# Patient Record
Sex: Female | Born: 1995 | Hispanic: Yes | Marital: Single | Smoking: Never smoker
Health system: Southern US, Community
[De-identification: ages and names within clinical notes are randomized; demographics above are authoritative.]

## PROBLEM LIST (undated history)

## (undated) DIAGNOSIS — K219 Gastro-esophageal reflux disease without esophagitis: Secondary | ICD-10-CM

## (undated) DIAGNOSIS — F32A Depression, unspecified: Secondary | ICD-10-CM

## (undated) DIAGNOSIS — F419 Anxiety disorder, unspecified: Secondary | ICD-10-CM

## (undated) HISTORY — DX: Gastro-esophageal reflux disease without esophagitis: K21.9

## (undated) HISTORY — PX: TONSILLECTOMY: SUR1361

## (undated) HISTORY — DX: Anxiety disorder, unspecified: F41.9

## (undated) HISTORY — DX: Depression, unspecified: F32.A

---

## 2019-06-10 ENCOUNTER — Ambulatory Visit
Admission: EM | Admit: 2019-06-10 | Discharge: 2019-06-10 | Disposition: A | Discharge status: Home / Self Care | Payer: No Typology Code available for payment source | Attending: Family Medicine | Admitting: Family Medicine

## 2019-06-10 ENCOUNTER — Other Ambulatory Visit: Payer: Self-pay

## 2019-06-10 ENCOUNTER — Encounter: Payer: Self-pay | Admitting: Emergency Medicine

## 2019-06-10 DIAGNOSIS — R300 Dysuria: Secondary | ICD-10-CM

## 2019-06-10 LAB — URINALYSIS, COMPLETE (UACMP) WITH MICROSCOPIC
Bilirubin Urine: NEGATIVE
Glucose, UA: NEGATIVE mg/dL
Ketones, ur: NEGATIVE mg/dL
Nitrite: NEGATIVE
Protein, ur: NEGATIVE mg/dL
Specific Gravity, Urine: 1.02 (ref 1.005–1.030)
pH: 5.5 (ref 5.0–8.0)

## 2019-06-10 MED ORDER — FLUCONAZOLE 150 MG PO TABS: 150.0000 mg | ORAL_TABLET | Freq: Every day | ORAL | 0 refills | Status: DC

## 2019-06-10 MED ORDER — CEPHALEXIN 500 MG PO CAPS: 500.0000 mg | ORAL_CAPSULE | Freq: Two times a day (BID) | ORAL | 0 refills | Status: AC

## 2019-06-10 NOTE — ED Triage Notes (Signed)
Patient c/o urinary frequency and dysuria that started on Monday.

## 2019-06-10 NOTE — Discharge Instructions (Addendum)
Take medication as prescribed. Rest. Drink plenty of fluids. Monitor.  ° °Follow up with your primary care physician this week as needed. Return to Urgent care for new or worsening concerns.  ° °

## 2019-06-10 NOTE — ED Provider Notes (Signed)
[facility] ____________________________________________  Time seen: Approximately 7:04 PM  I have reviewed the triage vital signs and the nursing notes.   HISTORY  Chief Complaint Dysuria and Urinary Frequency   HPI Tracey Hebert is a 23 y.o. female presenting for evaluation of 2 days of urinary frequency, urinary urgency, burning with urination and suprapubic pressure.  States this feels like her previous urinary infections.  Has had occasional vaginal discharge, nonodorous, denies concerns of STDs and states no persistent vaginal discharge.  Denies abdominal pain, flank pain, fever or vomiting.  Occasional nausea.  Denies bowel changes.  Denies recent cough, sore throat or congestion or sickness.  Denies aggravating or alleviating factors.  Reports otherwise doing well.  Patient's last menstrual period was 06/04/2019.   History reviewed. No pertinent past medical history.  There are no active problems to display for this patient.   Past Surgical History:  Procedure Laterality Date  . TONSILLECTOMY       No current facility-administered medications for this encounter.   Current Outpatient Medications:  .  cephALEXin (KEFLEX) 500 MG capsule, Take 1 capsule (500 mg total) by mouth 2 (two) times daily for 7 days., Disp: 14 capsule, Rfl: 0 .  fluconazole (DIFLUCAN) 150 MG tablet, Take 1 tablet (150 mg total) by mouth daily. Take one pill orally,as needed, Disp: 1 tablet, Rfl: 0  Allergies Patient has no known allergies.  History reviewed. No pertinent family history.  Social History Social History   Tobacco Use  . Smoking status: Never Smoker  . Smokeless tobacco: Never Used  Substance Use Topics  . Alcohol use: Not Currently  . Drug use: Never    Review of Systems Constitutional: No fever ENT: No sore throat. Cardiovascular: Denies chest pain. Respiratory: Denies shortness of breath. Gastrointestinal: No abdominal pain.  No nausea, no vomiting.   No diarrhea.   Genitourinary: Positive for dysuria. Musculoskeletal: Negative for back pain. Skin: Negative for rash.   ____________________________________________   PHYSICAL EXAM:  VITAL SIGNS: ED Triage Vitals  Enc Vitals Group     BP 06/10/19 1759 121/61     Pulse Rate 06/10/19 1759 61     Resp 06/10/19 1759 18     Temp 06/10/19 1759 99.2 F (37.3 C)     Temp Source 06/10/19 1759 Oral     SpO2 06/10/19 1759 100 %     Weight 06/10/19 1754 155 lb (70.3 kg)     Height 06/10/19 1754 5\' 3"  (1.6 m)     Head Circumference --      Peak Flow --      Pain Score 06/10/19 1753 5     Pain Loc --      Pain Edu? --      Excl. in GC? --     Constitutional: Alert and oriented. Well appearing and in no acute distress. Eyes: Conjunctivae are normal.  ENT      Head: Normocephalic and atraumatic. Cardiovascular: Normal rate, regular rhythm. Grossly normal heart sounds.  Good peripheral circulation. Respiratory: Normal respiratory effort without tachypnea nor retractions. Breath sounds are clear and equal bilaterally. No wheezes, rales, rhonchi. Gastrointestinal: Soft and nontender.No CVA tenderness. Musculoskeletal: Steady gait.   Neurologic:  Normal speech and language. Speech is normal. No gait instability.  Skin:  Skin is warm, dry and intact. No rash noted. Psychiatric: Mood and affect are normal. Speech and behavior are normal. Patient exhibits appropriate insight and judgment   ___________________________________________   LABS (all labs ordered are listed,  but only abnormal results are displayed)  Labs Reviewed  URINALYSIS, COMPLETE (UACMP) WITH MICROSCOPIC - Abnormal; Notable for the following components:      Result Value   Hgb urine dipstick MODERATE (*)    Leukocytes,Ua TRACE (*)    Bacteria, UA RARE (*)    All other components within normal limits  URINE CULTURE    PROCEDURES Procedures    INITIAL IMPRESSION / ASSESSMENT AND PLAN / ED COURSE  Pertinent labs  & imaging results that were available during my care of the patient were reviewed by me and considered in my medical decision making (see chart for details).  Well-appearing patient.  No acute distress.  Presenting for evaluation of dysuria.  Urinalysis reviewed.  Discussed multiple differentials including UTI, vaginal infection as well as nephrolithiasis.  Patient denies any flank or abdominal pain and only reports minimal suprapubic pressure, felt nephrolithiasis less likely and patient declined imaging.  Patient declined wet prep at this time and states that she will monitor.  Will culture urine empirically treat with oral Keflex.  Patient requests empiric post antibiotic Diflucan, Rx given.  Encourage rest, fluids and supportive care.Discussed indication, risks and benefits of medications with patient.   Discussed follow up with Primary care physician this week. Discussed follow up and return parameters including no resolution or any worsening concerns. Patient verbalized understanding and agreed to plan.   ____________________________________________   FINAL CLINICAL IMPRESSION(S) / ED DIAGNOSES  Final diagnoses:  Dysuria     ED Discharge Orders         Ordered    cephALEXin (KEFLEX) 500 MG capsule  2 times daily     06/10/19 1914    fluconazole (DIFLUCAN) 150 MG tablet  Daily     06/10/19 1914           Note: This dictation was prepared with Dragon dictation along with smaller phrase technology. Any transcriptional errors that result from this process are unintentional.         Marylene Land, NP 06/10/19 2019

## 2019-06-11 LAB — URINE CULTURE: Culture: <10000 — AB

## 2019-12-03 ENCOUNTER — Ambulatory Visit: Payer: No Typology Code available for payment source | Admitting: Family Medicine

## 2020-01-11 ENCOUNTER — Other Ambulatory Visit: Payer: Self-pay | Admitting: Family Medicine

## 2020-01-11 DIAGNOSIS — Z3401 Encounter for supervision of normal first pregnancy, first trimester: Secondary | ICD-10-CM

## 2020-01-12 ENCOUNTER — Ambulatory Visit
Admission: RE | Admit: 2020-01-12 | Discharge: 2020-01-12 | Disposition: A | Discharge status: Home / Self Care | Payer: No Typology Code available for payment source | Source: Ambulatory Visit | Attending: Family Medicine | Admitting: Family Medicine

## 2020-01-12 ENCOUNTER — Other Ambulatory Visit: Payer: Self-pay | Admitting: Family Medicine

## 2020-01-12 ENCOUNTER — Other Ambulatory Visit: Payer: Self-pay

## 2020-01-12 DIAGNOSIS — Z3491 Encounter for supervision of normal pregnancy, unspecified, first trimester: Secondary | ICD-10-CM

## 2020-01-12 DIAGNOSIS — O3680X1 Pregnancy with inconclusive fetal viability, fetus 1: Secondary | ICD-10-CM | POA: Insufficient documentation

## 2020-01-12 DIAGNOSIS — Z3401 Encounter for supervision of normal first pregnancy, first trimester: Secondary | ICD-10-CM

## 2020-01-12 DIAGNOSIS — O209 Hemorrhage in early pregnancy, unspecified: Secondary | ICD-10-CM

## 2020-01-12 DIAGNOSIS — O039 Complete or unspecified spontaneous abortion without complication: Secondary | ICD-10-CM | POA: Diagnosis not present

## 2020-01-12 LAB — CBC
HCT: 38.5 % (ref 36.0–46.0)
Hemoglobin: 13 g/dL (ref 12.0–15.0)
MCH: 28.8 pg (ref 26.0–34.0)
MCHC: 33.8 g/dL (ref 30.0–36.0)
MCV: 85.4 fL (ref 80.0–100.0)
Platelets: 315 10*3/uL (ref 150–400)
RBC: 4.51 MIL/uL (ref 3.87–5.11)
RDW: 13.4 % (ref 11.5–15.5)
WBC: 11 10*3/uL — ABNORMAL HIGH (ref 4.0–10.5)
nRBC: 0 % (ref 0.0–0.2)

## 2020-01-12 LAB — POCT PREGNANCY, URINE: Preg Test, Ur: POSITIVE — AB

## 2020-01-12 NOTE — ED Triage Notes (Signed)
Pt arrives to ED via POV from home with c/o possible miscarriage. Pt reports having an US done today with abnormal results ("no fetal pole"). Pt reports heavy vaginal bleeding and passing of POC. Pt reports using "over 6" feminine pads over the last several hours. Pt is A&O, in NAD; RR even, regular, and unlabored.

## 2020-01-13 ENCOUNTER — Emergency Department
Admission: EM | Admit: 2020-01-13 | Discharge: 2020-01-13 | Disposition: A | Discharge status: Home / Self Care | Payer: No Typology Code available for payment source | Attending: Emergency Medicine | Admitting: Emergency Medicine

## 2020-01-13 DIAGNOSIS — O418X10 Other specified disorders of amniotic fluid and membranes, first trimester, not applicable or unspecified: Secondary | ICD-10-CM

## 2020-01-13 DIAGNOSIS — O039 Complete or unspecified spontaneous abortion without complication: Secondary | ICD-10-CM

## 2020-01-13 DIAGNOSIS — O468X1 Other antepartum hemorrhage, first trimester: Secondary | ICD-10-CM

## 2020-01-13 LAB — COMPREHENSIVE METABOLIC PANEL
ALT: 17 U/L (ref 0–44)
AST: 15 U/L (ref 15–41)
Albumin: 4.7 g/dL (ref 3.5–5.0)
Alkaline Phosphatase: 49 U/L (ref 38–126)
Anion gap: 7 (ref 5–15)
BUN: 17 mg/dL (ref 6–20)
CO2: 25 mmol/L (ref 22–32)
Calcium: 8.9 mg/dL (ref 8.9–10.3)
Chloride: 106 mmol/L (ref 98–111)
Creatinine, Ser: 0.8 mg/dL (ref 0.44–1.00)
GFR calc Af Amer: >60 mL/min (ref >60)
GFR calc non Af Amer: >60 mL/min (ref >60)
Glucose, Bld: 113 mg/dL — ABNORMAL HIGH (ref 70–99)
Potassium: 3.8 mmol/L (ref 3.5–5.1)
Sodium: 138 mmol/L (ref 135–145)
Total Bilirubin: 0.6 mg/dL (ref 0.3–1.2)
Total Protein: 7.4 g/dL (ref 6.5–8.1)

## 2020-01-13 LAB — HCG, QUANTITATIVE, PREGNANCY: hCG, Beta Chain, Quant, S: 2655 m[IU]/mL — ABNORMAL HIGH (ref <5)

## 2020-01-13 LAB — ABO/RH: ABO/RH(D): AB POS

## 2020-01-13 MED ORDER — HYDROCODONE-ACETAMINOPHEN 5-325 MG PO TABS: 1.0000 | ORAL_TABLET | Freq: Four times a day (QID) | ORAL | 0 refills | Status: DC | PRN

## 2020-01-13 MED ORDER — HYDROCODONE-ACETAMINOPHEN 5-325 MG PO TABS
1.0000 | ORAL_TABLET | Freq: Once | ORAL | Status: AC
  Administered 2020-01-13: 1 via ORAL
  Filled 2020-01-13: qty 1

## 2020-01-13 NOTE — Discharge Instructions (Signed)
You may take Norco as needed for severe pain.  Drink plenty of fluids and restrict your activity.  Do not use tampons, douche or have sexual intercourse until seen by your doctor.  Return to the ER for worsening symptoms, soaking more than 1 maxi pad per hour, fainting or other concerns.

## 2020-01-13 NOTE — ED Provider Notes (Signed)
[facility] Emergency Department Provider Note   ____________________________________________   First MD Initiated Contact with Patient 01/13/20 [phone]     (approximate)  I have reviewed the triage vital signs and the nursing notes.   HISTORY  Chief Complaint Miscarriage    HPI Tracey Hebert is a 24 y.o. female G1, P0 who presents to the ED from home with a chief complaint of vaginal bleeding.  LMP in April, unsure of dates.  Patient had an ultrasound done which was ordered from [facility] yesterday which demonstrated: "Single intrauterine gestational sac measuring 6 weeks 3 days by mean sac diameter. No yolk sac or embryo visualized.  Small subchorionic hemorrhage.  Findings are suspicious but not yet definitive for failed pregnancy. Recommendfollow-up [place] in 14 days for definitive diagnosis."  Patient has had vaginal bleeding x3 to 4 days with pelvic cramping.  States she passed clots and what looked like a sac this evening.  Reports beta hCG done yesterday around 2600.  Last sexual intercourse 5 days ago.  Denies fever, cough, chest pain, shortness of breath, nausea, vomiting or dysuria.     Past medical history None  There are no problems to display for this patient.   Past Surgical History:  Procedure Laterality Date  . TONSILLECTOMY      Prior to Admission medications   Medication Sig Start Date End Date Taking? Authorizing Provider  fluconazole (DIFLUCAN) 150 MG tablet Take 1 tablet (150 mg total) by mouth daily. Take one pill orally,as needed 06/10/19   Renford Dills, NP    Allergies Patient has no known allergies.  No family history on file.  Social History Social History   Tobacco Use  . Smoking status: Never Smoker  . Smokeless tobacco: Never Used  Substance Use Topics  . Alcohol use: Not Currently  . Drug use: Never    Review of Systems  Constitutional: No fever/chills Eyes: No visual changes. ENT:  No sore throat. Cardiovascular: Denies chest pain. Respiratory: Denies shortness of breath. Gastrointestinal: No abdominal pain.  No nausea, no vomiting.  No diarrhea.  No constipation. Genitourinary: Negative for dysuria. Musculoskeletal: Negative for back pain. Skin: Negative for rash. Neurological: Negative for headaches, focal weakness or numbness.   ____________________________________________   PHYSICAL EXAM:  VITAL SIGNS: ED Triage Vitals  Enc Vitals Group     BP 01/12/20 2317 123/73     Pulse Rate 01/12/20 2317 62     Resp 01/12/20 2317 18     Temp 01/12/20 2317 98.3 F (36.8 C)     Temp Source 01/12/20 2317 Oral     SpO2 01/12/20 2317 100 %     Weight 01/12/20 2322 175 lb (79.4 kg)     Height 01/12/20 2322 5\' 4"  (1.626 m)     Head Circumference --      Peak Flow --      Pain Score 01/12/20 2322 9     Pain Loc --      Pain Edu? --      Excl. in GC? --     Constitutional: Alert and oriented. Well appearing and in no acute distress. Eyes: Conjunctivae are normal. PERRL. EOMI. Head: Atraumatic. Nose: No congestion/rhinnorhea. Mouth/Throat: Mucous membranes are moist.  Oropharynx non-erythematous. Neck: No stridor.   Cardiovascular: Normal rate, regular rhythm. Grossly normal heart sounds.  Good peripheral circulation. Respiratory: Normal respiratory effort.  No retractions. Lungs CTAB. Gastrointestinal: Soft and nontender to light or deep palpation. No distention. No abdominal  bruits. No CVA tenderness. Pelvic: External exam within normal limits without rashes, lesions or vesicles.  Speculum exam reveals mild vaginal bleeding.  Cervical os is closed.  Bimanual exam unremarkable. Musculoskeletal: No lower extremity tenderness nor edema.  No joint effusions. Neurologic:  Normal speech and language. No gross focal neurologic deficits are appreciated. No gait instability. Skin:  Skin is warm, dry and intact. No rash noted. Psychiatric: Mood and affect are normal.  Speech and behavior are normal.  ____________________________________________   LABS (all labs ordered are listed, but only abnormal results are displayed)  Labs Reviewed  CBC - Abnormal; Notable for the following components:      Result Value   WBC 11.0 (*)    All other components within normal limits  COMPREHENSIVE METABOLIC PANEL - Abnormal; Notable for the following components:   Glucose, Bld 113 (*)    All other components within normal limits  HCG, QUANTITATIVE, PREGNANCY - Abnormal; Notable for the following components:   hCG, Beta Chain, Quant, S 2,655 (*)    All other components within normal limits  POCT PREGNANCY, URINE - Abnormal; Notable for the following components:   Preg Test, Ur POSITIVE (*)    All other components within normal limits  POC URINE PREG, ED  ABO/RH   ____________________________________________  EKG  None ____________________________________________  RADIOLOGY  ED MD interpretation: Small subchorionic hemorrhage, single IUP 6-week 3-day gestational sac without yolk sac or embryo  Official radiology report(s): US OB LESS THAN 14 WEEKS WITH OB TRANSVAGINAL  Result Date: 01/12/2020 CLINICAL DATA:  First trimester vaginal bleeding.  Unsure of LMP. EXAM: OBSTETRIC <14 WK [place] AND TRANSVAGINAL OB US TECHNIQUE: Both transabdominal and transvaginal ultrasound examinations were performed for complete evaluation of the gestation as well as the maternal uterus, adnexal regions, and pelvic cul-de-sac. Transvaginal technique was performed to assess early pregnancy. COMPARISON:  None. FINDINGS: Intrauterine gestational sac: Single; thin choriodecidual reaction noted Yolk sac:  Not Visualized. Embryo:  Not Visualized. MSD: 16 mm   6 w   3 d Subchorionic hemorrhage:  Small subchorionic hemorrhage noted. Maternal uterus/adnexae: Both ovaries are normal appearance. No mass or abnormal free fluid identified. IMPRESSION: Single intrauterine gestational sac measuring 6  weeks 3 days by mean sac diameter. No yolk sac or embryo visualized. Findings are suspicious but not yet definitive for failed pregnancy. Recommend follow-up US in 14 days for definitive diagnosis. This recommendation follows SRU consensus guidelines: Diagnostic Criteria for Nonviable Pregnancy Early in the First Trimester. Malva Limes Med 2013; 427:0623-76. Electronically Signed   By: Danae Orleans M.D.   On: 01/12/2020 16:10    ____________________________________________   PROCEDURES  Procedure(s) performed (including Critical Care):  Procedures   ____________________________________________   INITIAL IMPRESSION / ASSESSMENT AND PLAN / ED COURSE  As part of my medical decision making, I reviewed the following data within the electronic MEDICAL RECORD NUMBER History obtained from family, Nursing notes reviewed and incorporated, Labs reviewed, Old chart reviewed, Radiograph reviewed, Notes from prior ED visits and [place] Controlled Substance Database     Zaida Reiland was evaluated in Emergency Department on 01/13/2020 for the symptoms described in the history of present illness. She was evaluated in the context of the global COVID-19 pandemic, which necessitated consideration that the patient might be at risk for infection with the SARS-CoV-2 virus that causes COVID-19. Institutional protocols and algorithms that pertain to the evaluation of patients at risk for COVID-19 are in a state of rapid change based on information  released by regulatory bodies including the CDC and federal and state organizations. These policies and algorithms were followed during the patient's care in the ED.    24 year old G1, P0 presenting with vaginal bleeding. Differential diagnosis includes, but is not limited to, ovarian cyst, ovarian torsion, acute appendicitis, diverticulitis, urinary tract infection/pyelonephritis, endometriosis, bowel obstruction, colitis, renal colic, gastroenteritis, hernia, fibroids,  endometriosis, pregnancy related pain including ectopic pregnancy, etc.   Patient is AB+ with ultrasound concerning for miscarriage.  Will administer analgesia.  Encouraged follow-up with OB/GYN for repeat ultrasound and beta hCG in 1 week.  Strict return precautions given.  Patient and family member verbalized understanding agree with plan of care.      ____________________________________________   FINAL CLINICAL IMPRESSION(S) / ED DIAGNOSES  Final diagnoses:  Miscarriage  Subchorionic hematoma in first trimester, single or unspecified fetus     ED Discharge Orders    None       Note:  This document was prepared using Dragon voice recognition software and may include unintentional dictation errors.   Irean Hong, MD 01/13/20 [phone]

## 2020-01-27 ENCOUNTER — Ambulatory Visit: Payer: Self-pay

## 2020-01-27 ENCOUNTER — Ambulatory Visit
Admission: RE | Admit: 2020-01-27 | Discharge: 2020-01-27 | Disposition: A | Discharge status: Home / Self Care | Payer: No Typology Code available for payment source | Source: Ambulatory Visit | Attending: Emergency Medicine | Admitting: Emergency Medicine

## 2020-01-27 ENCOUNTER — Other Ambulatory Visit: Payer: Self-pay

## 2020-01-27 VITALS — BP 120/89 | HR 76 | Temp 98.5°F | Resp 18 | Ht 64.0 in | Wt 175.0 lb

## 2020-01-27 DIAGNOSIS — J0140 Acute pansinusitis, unspecified: Secondary | ICD-10-CM | POA: Diagnosis not present

## 2020-01-27 MED ORDER — IBUPROFEN 600 MG PO TABS
600.0000 mg | ORAL_TABLET | Freq: Four times a day (QID) | ORAL | 0 refills | Status: DC | PRN
Start: 2020-01-27 — End: 2020-05-27

## 2020-01-27 MED ORDER — IPRATROPIUM BROMIDE 0.06 % NA SOLN
2.0000 | Freq: Four times a day (QID) | NASAL | 0 refills | Status: DC
Start: 2020-01-27 — End: 2020-05-27

## 2020-01-27 MED ORDER — DOXYCYCLINE HYCLATE 100 MG PO CAPS
100.0000 mg | ORAL_CAPSULE | Freq: Two times a day (BID) | ORAL | 0 refills | Status: AC
Start: 2020-01-27 — End: 2020-02-03

## 2020-01-27 MED ORDER — FLUTICASONE PROPIONATE 50 MCG/ACT NA SUSP
2.0000 | Freq: Every day | NASAL | 0 refills | Status: DC
Start: 2020-01-27 — End: 2020-12-22

## 2020-01-27 NOTE — ED Triage Notes (Addendum)
Pt c/o sinus congestion, post nasal drainage, sinus pain and pressure and right ear pain. Started about 2 days ago. Denies fever. Pt declines covid testing.

## 2020-01-27 NOTE — ED Provider Notes (Signed)
HPI  SUBJECTIVE:  Tracey Hebert is a 24 y.o. female who presents with 2 days of sinus pain/pressure, nasal congestion, clear rhinorrhea, postnasal drip, right ear pain.  She reports sinus headache, cough secondary to the postnasal drip.  She denies fevers, body aches, sore throat, loss of sense of smell or taste, shortness of breath, nausea, vomiting, abdominal pain, Covid exposure.  She had Covid in January 2021 and had both doses of the vaccine in March.  She reports diarrhea, states that this is resolving from a recent trip to [place].  No facial swelling, upper dental pain.  No allergy symptoms.  No change in hearing, otorrhea.  She tried Mucinex, hot teas, Tylenol sinus.  TM Tylenol Sinus seems to help.  No aggravating factors.  No no biotics recently.  She took Tylenol within 6 hours of evaluation.  She has a past medical history frequent sinusitis and Covid.  No history of diabetes, hypertension, allergies, asthma.  LMP: 3 months ago.  States that she had a miscarriage 2 weeks ago.  She denies the possibility being pregnant.  PMD: Emogene Morgan, MD  History reviewed. No pertinent past medical history.  Past Surgical History:  Procedure Laterality Date  . TONSILLECTOMY      History reviewed. No pertinent family history.  Social History   Tobacco Use  . Smoking status: Never Smoker  . Smokeless tobacco: Never Used  Vaping Use  . Vaping Use: Never used  Substance Use Topics  . Alcohol use: Not Currently  . Drug use: Never    No current facility-administered medications for this encounter.  Current Outpatient Medications:  .  doxycycline (VIBRAMYCIN) 100 MG capsule, Take 1 capsule (100 mg total) by mouth 2 (two) times daily for 7 days., Disp: 14 capsule, Rfl: 0 .  fluticasone (FLONASE) 50 MCG/ACT nasal spray, Place 2 sprays into both nostrils daily., Disp: 16 g, Rfl: 0 .  ibuprofen (ADVIL) 600 MG tablet, Take 1 tablet (600 mg total) by mouth every 6 (six) hours as  needed., Disp: 30 tablet, Rfl: 0 .  ipratropium (ATROVENT) 0.06 % nasal spray, Place 2 sprays into both nostrils 4 (four) times daily., Disp: 15 mL, Rfl: 0  No Known Allergies   ROS  As noted in HPI.   Physical Exam  BP (!) 120/89 (BP Location: Right Arm)   Pulse 76   Temp 98.5 F (36.9 C) (Oral)   Resp 18   Ht 5\' 4"  (1.626 m)   Wt 79.4 kg   LMP 10/28/2019 Comment: pt had a recent miscarriage about 3 weeks ago  SpO2 99%   BMI 30.05 kg/m    Constitutional: Well developed, well nourished, no acute distress Eyes:  EOMI, conjunctiva normal bilaterally HENT: Normocephalic, atraumatic,mucus membranes moist.  TMs normal bilateral.  Erythematous, swollen turbinates.  Clear nasal congestion.  Positive maxillary, frontal sinus tenderness.  Tonsils surgically absent.  Normal oropharynx.  Extensive postnasal drip.  Positive cobblestoning. Neck: No cervical lymphadenopathy Respiratory: Normal inspiratory effort, lungs clear bilaterally Cardiovascular: Normal rate regular rhythm no murmurs rubs or gallops GI: nondistended skin: No rash, skin intact Musculoskeletal: no deformities Neurologic: Alert & oriented x 3, no focal neuro deficits Psychiatric: Speech and behavior appropriate   ED Course   Medications - No data to display  No orders of the defined types were placed in this encounter.   No results found for this or any previous visit (from the past 24 hour(s)). No results found.  ED Clinical Impression  1.  Acute non-recurrent pansinusitis      ED Assessment/Plan  Patient with sinusitis, most likely viral.  Discussed this with patient.  Home with Mucinex D, saline nasal irrigation, Flonase, Atrovent nasal spray given the extensive postnasal drip, ibuprofen combined with Tylenol 3-4 times a day as needed for pain.  We will give her a wait-and-see prescription of doxycycline, but discussed the indications for filling this.  Advised her to not fill this for 8 days, unless  she starts running a fever or for double sickening.  There is no evidence of otitis today.  Follow-up with PMD as needed.  Discussed MDM, treatment plan, and plan for follow-up with patient.patient agrees with plan.   Meds ordered this encounter  Medications  . doxycycline (VIBRAMYCIN) 100 MG capsule    Sig: Take 1 capsule (100 mg total) by mouth 2 (two) times daily for 7 days.    Dispense:  14 capsule    Refill:  0  . fluticasone (FLONASE) 50 MCG/ACT nasal spray    Sig: Place 2 sprays into both nostrils daily.    Dispense:  16 g    Refill:  0  . ipratropium (ATROVENT) 0.06 % nasal spray    Sig: Place 2 sprays into both nostrils 4 (four) times daily.    Dispense:  15 mL    Refill:  0  . ibuprofen (ADVIL) 600 MG tablet    Sig: Take 1 tablet (600 mg total) by mouth every 6 (six) hours as needed.    Dispense:  30 tablet    Refill:  0    *This clinic note was created using Scientist, clinical (histocompatibility and immunogenetics). Therefore, there may be occasional mistakes despite careful proofreading.   ?    Domenick Gong, MD 01/27/20 2052

## 2020-01-27 NOTE — Discharge Instructions (Addendum)
Mucinex D, saline nasal irrigation Lloyd Huger med rinse and distilled water as often as you want, Flonase for the inflammation, Atrovent nasal spray for the drainage, 600 mg of ibuprofen combined with 1000 mg of Tylenol 3-4 times a day as needed for pain. wait-and-see prescription of doxycycline-do not fill this for 8 days, unless she starts running a fever or for double sickening.

## 2020-05-27 ENCOUNTER — Other Ambulatory Visit: Payer: Self-pay

## 2020-05-27 ENCOUNTER — Ambulatory Visit
Admission: RE | Admit: 2020-05-27 | Discharge: 2020-05-27 | Disposition: A | Discharge status: Home / Self Care | Payer: No Typology Code available for payment source | Source: Ambulatory Visit | Attending: Emergency Medicine | Admitting: Emergency Medicine

## 2020-05-27 VITALS — BP 129/73 | HR 68 | Temp 97.8°F | Resp 18

## 2020-05-27 DIAGNOSIS — J069 Acute upper respiratory infection, unspecified: Secondary | ICD-10-CM | POA: Diagnosis not present

## 2020-05-27 DIAGNOSIS — J0140 Acute pansinusitis, unspecified: Secondary | ICD-10-CM

## 2020-05-27 MED ORDER — BENZONATATE 200 MG PO CAPS
200.0000 mg | ORAL_CAPSULE | Freq: Three times a day (TID) | ORAL | 0 refills | Status: DC | PRN
Start: 2020-05-27 — End: 2020-12-22

## 2020-05-27 MED ORDER — AMOXICILLIN-POT CLAVULANATE 875-125 MG PO TABS
1.0000 | ORAL_TABLET | Freq: Two times a day (BID) | ORAL | 0 refills | Status: DC
Start: 2020-05-27 — End: 2020-12-22

## 2020-05-27 MED ORDER — HYDROCOD POLST-CPM POLST ER 10-8 MG/5ML PO SUER: 5.0000 mL | Freq: Two times a day (BID) | ORAL | 0 refills | Status: DC | PRN

## 2020-05-27 MED ORDER — AEROCHAMBER PLUS MISC
2 refills | Status: DC
Start: 2020-05-27 — End: 2020-12-22

## 2020-05-27 MED ORDER — ALBUTEROL SULFATE HFA 108 (90 BASE) MCG/ACT IN AERS: 1.0000 | INHALATION_SPRAY | RESPIRATORY_TRACT | 0 refills | Status: DC | PRN

## 2020-05-27 MED ORDER — IBUPROFEN 600 MG PO TABS: 600.0000 mg | ORAL_TABLET | Freq: Four times a day (QID) | ORAL | 0 refills | Status: DC | PRN

## 2020-05-27 NOTE — ED Triage Notes (Signed)
Pt c/o productive cough with mucus and nasal congestion onset Monday. Pt states she was covid tested on Tuesday which was negative. Pt states she has not been able to sleep due to cough. Pt states she has been taking mucinex.

## 2020-05-27 NOTE — Discharge Instructions (Addendum)
Take the medication as written. Start Mucinex-D to keep the mucous thin and to decongest you.  You may take 600 mg of motrin with 1 gram of tylenol up to 3-4 times a day as needed for pain. This is an effective combination for pain.  Most sinus infections are viral and do not need antibiotics unless you have a high fever, have had this for 10 days, or you get better and then get sick again. Use a NeilMed sinus rinse as often as you want to to reduce nasal congestion. Follow the directions on the box.  Continue Flonase.  2 puffs from your albuterol inhaler using your spacer every 4-6 hours as needed for cough.  Tessalon for the cough during the day, Tussionex for the cough at night.  Do not fill the Augmentin.  Wait another 3 to 5 days before filling it.  Go to www.goodrx.com to look up your medications. This will give you a list of where you can find your prescriptions at the most affordable prices. Or you can ask the pharmacist what the cash price is. This is frequently cheaper than going through insurance.

## 2020-05-27 NOTE — ED Provider Notes (Signed)
HPI  SUBJECTIVE:  Tracey Hebert is a 24 y.o. female who presents with a "sinus infection" starting 1 week ago that has "settled in my chest".  She reports 5 days of a cough, chest soreness secondary to the cough, inability to sleep at night secondary to coughing.  She reports headache secondary to cough, nasal congestion, clear rhinorrhea, sore throat, postnasal drip, sinus pain and pressure, upper dental pain, wheezing at night, mild shortness of breath with exertion.  She nausea and reports 1 episode of emesis yesterday.  No fevers, body aches, facial swelling, loss of sense of smell or taste, diarrhea, abdominal pain.  Antibiotics in the past month.  No antipyretic in the past 6 hours.  No known Covid or flu exposure.  She has gotten both Covid and flu vaccine.  She was tested through health at work earlier this week and Covid was negative.  She is try Flonase, Astelin, Mucinex DM, tea with honey, Delsym and saline nasal irrigation, Vicks, NyQuil, DayQuil, Robitussin-DM and steam.  Mucinex and saline nasal irrigation to help.  Symptoms are worse with lying down and talking.  She has a past medical history of asthma.  She is not a smoker.  No history of diabetes, hypertension.  She has had sinusitis once this year.  She is status post tonsillectomy/adenoidectomy.  LMP.  She just finished menses.  Denies possibility pregnant.  PMD: Emogene Morgan, MD     History reviewed. No pertinent past medical history.  Past Surgical History:  Procedure Laterality Date  . TONSILLECTOMY      History reviewed. No pertinent family history.  Social History   Tobacco Use  . Smoking status: Never Smoker  . Smokeless tobacco: Never Used  Vaping Use  . Vaping Use: Never used  Substance Use Topics  . Alcohol use: Not Currently  . Drug use: Never    No current facility-administered medications for this encounter.  Current Outpatient Medications:  .  fluticasone (FLONASE) 50 MCG/ACT nasal  spray, Place 2 sprays into both nostrils daily., Disp: 16 g, Rfl: 0 .  albuterol (VENTOLIN HFA) 108 (90 Base) MCG/ACT inhaler, Inhale 1-2 puffs into the lungs every 4 (four) hours as needed for wheezing or shortness of breath., Disp: 1 each, Rfl: 0 .  amoxicillin-clavulanate (AUGMENTIN) 875-125 MG tablet, Take 1 tablet by mouth 2 (two) times daily. X 7 days, Disp: 14 tablet, Rfl: 0 .  benzonatate (TESSALON) 200 MG capsule, Take 1 capsule (200 mg total) by mouth 3 (three) times daily as needed for cough., Disp: 30 capsule, Rfl: 0 .  chlorpheniramine-HYDROcodone (TUSSIONEX PENNKINETIC ER) 10-8 MG/5ML SUER, Take 5 mLs by mouth every 12 (twelve) hours as needed for cough., Disp: 60 mL, Rfl: 0 .  ibuprofen (ADVIL) 600 MG tablet, Take 1 tablet (600 mg total) by mouth every 6 (six) hours as needed., Disp: 30 tablet, Rfl: 0 .  Spacer/Aero-Holding Chambers (AEROCHAMBER PLUS) inhaler, Use with inhaler, Disp: 1 each, Rfl: 2  No Known Allergies   ROS  As noted in HPI.   Physical Exam  BP 129/73 (BP Location: Right Arm)   Pulse 68   Temp 97.8 F (36.6 C) (Oral)   Resp 18   LMP 05/23/2020 Comment: o  SpO2 99%   Breastfeeding Unknown   Constitutional: Well developed, well nourished, no acute distress coughing Eyes:  EOMI, conjunctiva normal bilaterally HENT: Normocephalic, atraumatic,mucus membranes moist positive maxillary, frontal sinus tenderness.  Normal turbinates.  Positive nasal congestion.  Absent tonsils.  Positive  postnasal drip  respiratory: Normal inspiratory effort, lungs clear bilaterally, positive anterior chest wall tenderness Cardiovascular: Normal rate GI: nondistended skin: No rash, skin intact Musculoskeletal: no deformities Neurologic: Alert & oriented x 3, no focal neuro deficits Psychiatric: Speech and behavior appropriate   ED Course   Medications - No data to display  No orders of the defined types were placed in this encounter.   No results found for this or  any previous visit (from the past 24 hour(s)). No results found.  ED Clinical Impression  1. Viral URI with cough   2. Acute non-recurrent pansinusitis      ED Assessment/Plan  [place] Narcotic database reviewed for this patient, and feel that the risk/benefit ratio today is favorable for proceeding with a prescription for controlled substance.  Patient with URI.  She has soft indications for antibiotics.  Recent Covid testing negative will not repeat.  Sending home with Tylenol/ibuprofen, and albuterol inhaler with a spacer and history of asthma, Tessalon for the cough during the day, Tussionex for the cough at night.  Continue Flonase, saline nasal irrigation, Mucinex D.  Wait-and-see prescription of Augmentin.  Follow-up with PMD as needed.    Meds ordered this encounter  Medications  . ibuprofen (ADVIL) 600 MG tablet    Sig: Take 1 tablet (600 mg total) by mouth every 6 (six) hours as needed.    Dispense:  30 tablet    Refill:  0  . amoxicillin-clavulanate (AUGMENTIN) 875-125 MG tablet    Sig: Take 1 tablet by mouth 2 (two) times daily. X 7 days    Dispense:  14 tablet    Refill:  0  . albuterol (VENTOLIN HFA) 108 (90 Base) MCG/ACT inhaler    Sig: Inhale 1-2 puffs into the lungs every 4 (four) hours as needed for wheezing or shortness of breath.    Dispense:  1 each    Refill:  0  . Spacer/Aero-Holding Chambers (AEROCHAMBER PLUS) inhaler    Sig: Use with inhaler    Dispense:  1 each    Refill:  2    Please educate patient on use  . benzonatate (TESSALON) 200 MG capsule    Sig: Take 1 capsule (200 mg total) by mouth 3 (three) times daily as needed for cough.    Dispense:  30 capsule    Refill:  0  . chlorpheniramine-HYDROcodone (TUSSIONEX PENNKINETIC ER) 10-8 MG/5ML SUER    Sig: Take 5 mLs by mouth every 12 (twelve) hours as needed for cough.    Dispense:  60 mL    Refill:  0    *This clinic note was created using Scientist, clinical (histocompatibility and immunogenetics). Therefore, there may be  occasional mistakes despite careful proofreading.   ?    Domenick Gong, MD 05/28/20 [phone]

## 2020-09-26 ENCOUNTER — Ambulatory Visit: Payer: No Typology Code available for payment source | Admitting: Family Medicine

## 2020-10-06 ENCOUNTER — Ambulatory Visit: Payer: No Typology Code available for payment source | Admitting: Family Medicine

## 2020-10-11 ENCOUNTER — Ambulatory Visit: Payer: No Typology Code available for payment source | Admitting: Family Medicine

## 2020-12-22 ENCOUNTER — Other Ambulatory Visit: Payer: Self-pay

## 2020-12-22 ENCOUNTER — Ambulatory Visit (INDEPENDENT_AMBULATORY_CARE_PROVIDER_SITE_OTHER): Payer: No Typology Code available for payment source | Admitting: Internal Medicine

## 2020-12-22 ENCOUNTER — Encounter: Payer: Self-pay | Admitting: Internal Medicine

## 2020-12-22 VITALS — BP 130/74 | HR 85 | Temp 98.6°F | Resp 17 | Ht 64.0 in | Wt 182.0 lb

## 2020-12-22 DIAGNOSIS — K219 Gastro-esophageal reflux disease without esophagitis: Secondary | ICD-10-CM

## 2020-12-22 DIAGNOSIS — L719 Rosacea, unspecified: Secondary | ICD-10-CM

## 2020-12-22 DIAGNOSIS — J45909 Unspecified asthma, uncomplicated: Secondary | ICD-10-CM | POA: Insufficient documentation

## 2020-12-22 DIAGNOSIS — Z3491 Encounter for supervision of normal pregnancy, unspecified, first trimester: Secondary | ICD-10-CM

## 2020-12-22 DIAGNOSIS — J4520 Mild intermittent asthma, uncomplicated: Secondary | ICD-10-CM

## 2020-12-22 MED ORDER — OMEPRAZOLE 20 MG PO CPDR
20.0000 mg | DELAYED_RELEASE_CAPSULE | Freq: Every day | ORAL | 0 refills | Status: DC
Start: 2020-12-22 — End: 2021-01-19

## 2020-12-22 NOTE — Assessment & Plan Note (Signed)
Has failed Tums OTC Rx for omeprazole 20 mg daily, pregnancy implications reviewed with patient

## 2020-12-22 NOTE — Assessment & Plan Note (Signed)
Continue albuterol as needed 

## 2020-12-22 NOTE — Assessment & Plan Note (Signed)
Referral to dermatology for further evaluation and treatment 

## 2020-12-22 NOTE — Progress Notes (Signed)
HPI  GERD: She reports this triggered acidic foods, tomato based foods. She is not taking anything OTC for this at this time. She had an upper GI many years ago, but there is no report of this is Epic.  Anxiety and Depression: Persistent, currently managed without meds. She is not therapist. She denies SI/HI.  Reactive Airway Disease: She uses Albuterol as needed. She is not following with pulmonology.  Roscea: Managed with Metronidazole ointment, which she is not currently taking due to pregnancy. She has seen a dermatologist in the past for the same.   She reports her LMP was 5/15.  She has an appointment with an OB/GYN 01/13/2021.  She has had a miscarriage at 10 weeks in the past.  Past Medical History:  Diagnosis Date   Anxiety    Depression    GERD (gastroesophageal reflux disease)     Current Outpatient Medications  Medication Sig Dispense Refill   albuterol (VENTOLIN HFA) 108 (90 Base) MCG/ACT inhaler Inhale 1-2 puffs into the lungs every 4 (four) hours as needed for wheezing or shortness of breath. 1 each 0   ranitidine (ZANTAC) 150 MG tablet Take by mouth. (Patient not taking: Reported on 12/22/2020)     Spacer/Aero-Holding Chambers (AEROCHAMBER PLUS) inhaler Use with inhaler (Patient not taking: Reported on 12/22/2020) 1 each 2   No current facility-administered medications for this visit.    No Known Allergies  Family History  Problem Relation Age of Onset   Depression Mother    Depression Father     Social History   Socioeconomic History   Marital status: Single    Spouse name: Not on file   Number of children: Not on file   Years of education: Not on file   Highest education level: Not on file  Occupational History   Not on file  Tobacco Use   Smoking status: Never   Smokeless tobacco: Never  Vaping Use   Vaping Use: Never used  Substance and Sexual Activity   Alcohol use: Not Currently   Drug use: Never   Sexual activity: Not on file  Other Topics  Concern   Not on file  Social History Narrative   Not on file   Social Determinants of Health   Financial Resource Strain: Not on file  Food Insecurity: Not on file  Transportation Needs: Not on file  Physical Activity: Not on file  Stress: Not on file  Social Connections: Not on file  Intimate Partner Violence: Not on file    ROS:  Constitutional: Denies fever, malaise, fatigue, headache or abrupt weight changes.  HEENT: Denies eye pain, eye redness, ear pain, ringing in the ears, wax buildup, runny nose, nasal congestion, bloody nose, or sore throat. Respiratory: Denies difficulty breathing, shortness of breath, cough or sputum production.   Cardiovascular: Denies chest pain, chest tightness, palpitations or swelling in the hands or feet.  Gastrointestinal: Patient reports reflux.  Denies abdominal pain, bloating, constipation, diarrhea or blood in the stool.  GU: Patient reports amenorrhea.  Denies frequency, urgency, pain with urination, blood in urine, odor or discharge. Musculoskeletal: Denies decrease in range of motion, difficulty with gait, muscle pain or joint pain and swelling.  Skin: Patient reports intermittent rash of face.  Denies lesions or ulcercations.  Neurological: Denies dizziness, difficulty with memory, difficulty with speech or problems with balance and coordination.  Psych: Denies anxiety, depression, SI/HI.  No other specific complaints in a complete review of systems (except as listed in HPI above).  PE:  BP 130/74 (BP Location: Right Arm, Patient Position: Sitting, Cuff Size: Normal)   Pulse 85   Temp 98.6 F (37 C) (Temporal)   Resp 17   Ht 5\' 4"  (1.626 m)   Wt 182 lb (82.6 kg)   LMP  (Exact Date)   SpO2 99%   BMI 31.24 kg/m  Wt Readings from Last 3 Encounters:  12/22/20 182 lb (82.6 kg)  01/27/20 175 lb 0.7 oz (79.4 kg)  01/12/20 175 lb (79.4 kg)    General: Appears her stated age, pregnant, in NAD. HEENT: Head: normal shape and size;  Eyes: sclera white and EOMs intact;  Cardiovascular: Normal rate and rhythm. S1,S2 noted.  No murmur, rubs or gallops noted.  Pulmonary/Chest: Normal effort and positive vesicular breath sounds. No respiratory distress. No wheezes, rales or ronchi noted.  Abdomen: Normal bowel sounds. Musculoskeletal: No difficulty with gait.  Neurological: Alert and oriented.  Psychiatric: Mood and affect normal. Behavior is normal. Judgment and thought content normal.    BMET    Component Value Date/Time   NA 138 01/12/2020 2326   K 3.8 01/12/2020 2326   CL 106 01/12/2020 2326   CO2 25 01/12/2020 2326   GLUCOSE 113 (H) 01/12/2020 2326   BUN 17 01/12/2020 2326   CREATININE 0.80 01/12/2020 2326   CALCIUM 8.9 01/12/2020 2326   GFRNONAA >60 01/12/2020 2326   GFRAA >60 01/12/2020 2326    Lipid Panel  No results found for: CHOL, TRIG, HDL, CHOLHDL, VLDL, LDLCALC  CBC    Component Value Date/Time   WBC 11.0 (H) 01/12/2020 2326   RBC 4.51 01/12/2020 2326   HGB 13.0 01/12/2020 2326   HCT 38.5 01/12/2020 2326   PLT 315 01/12/2020 2326   MCV 85.4 01/12/2020 2326   MCH 28.8 01/12/2020 2326   MCHC 33.8 01/12/2020 2326   RDW 13.4 01/12/2020 2326    Hgb A1C No results found for: HGBA1C   Assessment and Plan:  First Trimester of Pregnancy:  Already taking prenatal vitamin Serum hCG ordered She will follow-up with OB  Return precautions discussed 01/14/2020, NP This visit occurred during the SARS-CoV-2 public health emergency.  Safety protocols were in place, including screening questions prior to the visit, additional usage of staff PPE, and extensive cleaning of exam room while observing appropriate contact time as indicated for disinfecting solutions.

## 2020-12-22 NOTE — Patient Instructions (Signed)

## 2020-12-23 ENCOUNTER — Other Ambulatory Visit: Payer: No Typology Code available for payment source

## 2020-12-23 DIAGNOSIS — Z3491 Encounter for supervision of normal pregnancy, unspecified, first trimester: Secondary | ICD-10-CM

## 2020-12-23 LAB — HCG, QUANTITATIVE, PREGNANCY: HCG, Total, QN: 19614 m[IU]/mL

## 2021-01-05 ENCOUNTER — Other Ambulatory Visit: Payer: Self-pay

## 2021-01-05 ENCOUNTER — Ambulatory Visit
Admission: RE | Admit: 2021-01-05 | Discharge: 2021-01-05 | Disposition: A | Discharge status: Home / Self Care | Payer: No Typology Code available for payment source | Source: Ambulatory Visit | Attending: Sports Medicine | Admitting: Sports Medicine

## 2021-01-05 VITALS — BP 117/80 | HR 93 | Temp 98.9°F | Resp 16

## 2021-01-05 DIAGNOSIS — O210 Mild hyperemesis gravidarum: Secondary | ICD-10-CM

## 2021-01-05 DIAGNOSIS — R0981 Nasal congestion: Secondary | ICD-10-CM | POA: Diagnosis not present

## 2021-01-05 DIAGNOSIS — J069 Acute upper respiratory infection, unspecified: Secondary | ICD-10-CM | POA: Diagnosis not present

## 2021-01-05 DIAGNOSIS — J3489 Other specified disorders of nose and nasal sinuses: Secondary | ICD-10-CM | POA: Diagnosis not present

## 2021-01-05 DIAGNOSIS — R0982 Postnasal drip: Secondary | ICD-10-CM

## 2021-01-05 DIAGNOSIS — R110 Nausea: Secondary | ICD-10-CM

## 2021-01-05 MED ORDER — DOXYLAMINE-PYRIDOXINE 10-10 MG PO TBEC: DELAYED_RELEASE_TABLET | ORAL | 0 refills | Status: DC

## 2021-01-05 MED ORDER — IPRATROPIUM BROMIDE 0.06 % NA SOLN: 2.0000 | Freq: Four times a day (QID) | NASAL | 12 refills | Status: DC

## 2021-01-05 NOTE — ED Provider Notes (Signed)
[facility]    CSN: [id number] Arrival date & time: 01/05/21  1539      History   Chief Complaint Chief Complaint  Patient presents with   Nasal Congestion   Nausea    HPI Tracey Hebert is a 25 y.o. female.   Patient is a 25 year old female who presents for evaluation of 2 issues.  The first issue is URI type symptoms including sinus pressure drainage and pressure in her ears with increased phlegm production.  It started about 3 to 4 days ago.  She also has a little scratchy throat.  No fever shakes chills.  She has a little bit of a cough from postnasal drip.  She has been vaccinated against COVID and has received the booster.  She is also received her flu shot.  She had a negative COVID test done through [facility] on 01/03/2021 and the results were negative today.  The second issue is nausea and vomiting.  She thinks it may be secondary to the postnasal drip but she is currently [redacted] weeks pregnant.  She did call her OB/GYN which is [facility] in [place].  There scheduled to see her for her initial visit next week.  They said for her to go to an urgent care or an ER as they could not see her in the office today.  She denies any chest pain or shortness of breath.  No abdominal or urinary symptoms.  No red flag signs or symptoms elicited on history.   Past Medical History:  Diagnosis Date   Anxiety    Depression    GERD (gastroesophageal reflux disease)     Patient Active Problem List   Diagnosis Date Noted   Gastroesophageal reflux disease without esophagitis 12/22/2020   Rosacea 12/22/2020   Reactive airway disease 12/22/2020    Past Surgical History:  Procedure Laterality Date   TONSILLECTOMY     TONSILLECTOMY      OB History     Gravida  2   Para      Term      Preterm      AB      Living         SAB      IAB      Ectopic      Multiple      Live Births               Home Medications    Prior to  Admission medications   Medication Sig Start Date End Date Taking? Authorizing Provider  Doxylamine-Pyridoxine 10-10 MG TBEC Two tablets at bedtime on day 1 and 2; if symptoms persist, take 1 tablet in morning and 2 tablets at bedtime on day 3; if symptoms persist, may increase to 1 tablet in morning, 1 tablet mid-afternoon, and 2 tablets at bedtime on day 4 (maximum: doxylamine 40 mg/pyridoxine 40 mg (4 tablets) per day). 01/05/21  Yes Delton See, MD  ipratropium (ATROVENT) 0.06 % nasal spray Place 2 sprays into both nostrils 4 (four) times daily. 01/05/21  Yes Delton See, MD  omeprazole (PRILOSEC) 20 MG capsule Take 1 capsule (20 mg total) by mouth daily. 12/22/20  Yes Baity, Salvadore Oxford, NP  albuterol (VENTOLIN HFA) 108 (90 Base) MCG/ACT inhaler Inhale 1-2 puffs into the lungs every 4 (four) hours as needed for wheezing or shortness of breath. 05/27/20   Domenick Gong, MD    Family History Family History  Problem Relation Age of Onset   Depression  Mother    Depression Father     Social History Social History   Tobacco Use   Smoking status: Never   Smokeless tobacco: Never  Vaping Use   Vaping Use: Never used  Substance Use Topics   Alcohol use: Not Currently   Drug use: Never     Allergies   Patient has no known allergies.   Review of Systems Review of Systems  Constitutional:  Negative for activity change, appetite change, chills, diaphoresis, fatigue and fever.  HENT:  Positive for congestion, ear pain, postnasal drip and sinus pressure. Negative for ear discharge, rhinorrhea, sinus pain, sneezing, sore throat and trouble swallowing.   Eyes:  Negative for pain.  Respiratory:  Negative for cough, chest tightness, shortness of breath and wheezing.   Cardiovascular:  Negative for chest pain and palpitations.  Gastrointestinal:  Positive for nausea and vomiting. Negative for abdominal pain and diarrhea.  Genitourinary:  Negative for dysuria, frequency and pelvic pain.   Musculoskeletal:  Negative for back pain, myalgias and neck pain.  Skin:  Negative for color change, pallor, rash and wound.  Neurological:  Negative for dizziness, light-headedness and headaches.  All other systems reviewed and are negative.   Physical Exam Triage Vital Signs ED Triage Vitals  Enc Vitals Group     BP 01/05/21 1603 117/80     Pulse Rate 01/05/21 1603 93     Resp 01/05/21 1603 16     Temp 01/05/21 1603 98.9 F (37.2 C)     Temp Source 01/05/21 1603 Oral     SpO2 01/05/21 1603 100 %     Weight --      Height --      Head Circumference --      Peak Flow --      Pain Score 01/05/21 1600 0     Pain Loc --      Pain Edu? --      Excl. in GC? --    No data found.  Updated Vital Signs BP 117/80   Pulse 93   Temp 98.9 F (37.2 C) (Oral)   Resp 16   LMP 11/13/2020 Comment: o  SpO2 100%   Breastfeeding Unknown   Visual Acuity Right Eye Distance:   Left Eye Distance:   Bilateral Distance:    Right Eye Near:   Left Eye Near:    Bilateral Near:     Physical Exam Vitals and nursing note reviewed.  Constitutional:      Appearance: Normal appearance.  HENT:     Head: Normocephalic and atraumatic.     Right Ear: Tympanic membrane normal.     Left Ear: Tympanic membrane normal.     Nose: Congestion present. No rhinorrhea.     Mouth/Throat:     Mouth: Mucous membranes are moist.     Pharynx: Posterior oropharyngeal erythema present. No oropharyngeal exudate.  Eyes:     General: No scleral icterus.       Right eye: No discharge.        Left eye: No discharge.     Extraocular Movements: Extraocular movements intact.     Conjunctiva/sclera: Conjunctivae normal.     Pupils: Pupils are equal, round, and reactive to light.  Cardiovascular:     Rate and Rhythm: Normal rate and regular rhythm.     Pulses: Normal pulses.     Heart sounds: Normal heart sounds. No murmur heard.   No friction rub. No gallop.  Pulmonary:  Effort: Pulmonary effort is  normal.     Breath sounds: Normal breath sounds. No stridor. No wheezing, rhonchi or rales.  Abdominal:     Tenderness: There is no abdominal tenderness. There is no right CVA tenderness, left CVA tenderness, guarding or rebound.  Musculoskeletal:     Cervical back: Normal range of motion and neck supple. No rigidity or tenderness.  Lymphadenopathy:     Cervical: No cervical adenopathy.  Skin:    General: Skin is warm and dry.     Capillary Refill: Capillary refill takes less than 2 seconds.     Coloration: Skin is not jaundiced.     Findings: No erythema, lesion or rash.  Neurological:     General: No focal deficit present.     Mental Status: She is alert and oriented to person, place, and time.     UC Treatments / Results  Labs (all labs ordered are listed, but only abnormal results are displayed) Labs Reviewed - No data to display  EKG   Radiology No results found.  Procedures Procedures (including critical care time)  Medications Ordered in UC Medications - No data to display  Initial Impression / Assessment and Plan / UC Course  I have reviewed the triage vital signs and the nursing notes.  Pertinent labs & imaging results that were available during my care of the patient were reviewed by me and considered in my medical decision making (see chart for details).  Clinical impression: 1.  Upper respiratory infection with increased mucus production 2.  Nasal congestion with postnasal drip 3.  Sinus pressure without sinus pain or sinusitis 4.  Nausea and vomiting consistent with hyperemesis gravidarum  Treatment plan: 1.  The findings and treatment plan were discussed in detail with the patient.  Patient was in agreement. 2.  I had a long discussion with her regarding her sinus issues and that she did not really meet the current ENT guidelines for antibiotic use for her sinus symptoms.  She voiced verbal understanding. 3.  I did prescribe Atrovent nasal spray which  was safe during pregnancy to help her with her nasal congestion and postnasal drip. 4.  Educational handouts provided and I have encouraged her to do sinus rinses. 5.  For her hyperemesis gravidarum I did prescribe a medication safe in pregnancy. 6.  I encouraged her to call back OB/GYN and see if they would see her sooner than next week. 7.  Tylenol only given that she is pregnant. 8.  She had no UTI symptoms so there was no reason to check a urine as this could also cause her to be nauseous and vomit. 9.  If she can get an OB/GYN I have encouraged her to see her primary care provider. 10.  She was stable upon discharge and she will follow-up here as needed.    Final Clinical Impressions(s) / UC Diagnoses   Final diagnoses:  Upper respiratory tract infection, unspecified type  Nasal congestion  Post-nasal drainage  Sinus pressure  Hyperemesis gravidarum  Nausea     Discharge Instructions      As we discussed, you do not meet the current ENT guidelines for antibiotic use for your sinus symptoms. I have prescribed you a nasal spray that is safe during pregnancy. I have also prescribed medication for your nausea and vomiting which may be attributable to your postnasal drip, but may also be due to your pregnancy. Please see educational handouts. I would encourage you to call back OB/GYN and  see if they will see you any sooner than next week.  Let them know that you were seen in an urgent care setting and your prescribed medication. Tylenol only for any discomfort due to your pregnancy. You can also contact your primary care provider if you cannot get into OB/GYN.     ED Prescriptions     Medication Sig Dispense Auth. Provider   Doxylamine-Pyridoxine 10-10 MG TBEC Two tablets at bedtime on day 1 and 2; if symptoms persist, take 1 tablet in morning and 2 tablets at bedtime on day 3; if symptoms persist, may increase to 1 tablet in morning, 1 tablet mid-afternoon, and 2 tablets at  bedtime on day 4 (maximum: doxylamine 40 mg/pyridoxine 40 mg (4 tablets) per day). 60 tablet Delton SeeBarnes, Cydne Grahn, MD   ipratropium (ATROVENT) 0.06 % nasal spray Place 2 sprays into both nostrils 4 (four) times daily. 15 mL Delton SeeBarnes, Westin Knotts, MD      PDMP not reviewed this encounter.   Delton SeeBarnes, Bladen Umar, MD 01/05/21 2133

## 2021-01-05 NOTE — ED Triage Notes (Signed)
Pt reports post nasal drip that is making her nauseated. PT has been vomiting daily. She is [redacted] weeks pregnant.   She has had a few negative covid tests through work.

## 2021-01-05 NOTE — Discharge Instructions (Addendum)
As we discussed, you do not meet the current ENT guidelines for antibiotic use for your sinus symptoms. I have prescribed you a nasal spray that is safe during pregnancy. I have also prescribed medication for your nausea and vomiting which may be attributable to your postnasal drip, but may also be due to your pregnancy. Please see educational handouts. I would encourage you to call back OB/GYN and see if they will see you any sooner than next week.  Let them know that you were seen in an urgent care setting and your prescribed medication. Tylenol only for any discomfort due to your pregnancy. You can also contact your primary care provider if you cannot get into OB/GYN.

## 2021-01-06 ENCOUNTER — Telehealth: Payer: Self-pay

## 2021-01-06 NOTE — Telephone Encounter (Signed)
Spoke w/patient. Notified unable to advise as she has not yet established care with our practice. Recommended to inquire with pharmacy.

## 2021-01-06 NOTE — Telephone Encounter (Signed)
Patient reports vomiting and congestion all week. She has covid tested every day (-). Went to urgent care yesterday. Would not treat d/t her being pregnant. She has been taking B6 and a sleeping tablet. It has helped her nausea/vomiting, but still has sinus congestion w/cough. Her PCP advised her to contact her OB. Cb#[phone]

## 2021-01-13 ENCOUNTER — Other Ambulatory Visit (HOSPITAL_COMMUNITY)
Admission: RE | Admit: 2021-01-13 | Discharge: 2021-01-13 | Disposition: A | Discharge status: Home / Self Care | Payer: No Typology Code available for payment source | Source: Ambulatory Visit | Attending: Obstetrics | Admitting: Obstetrics

## 2021-01-13 ENCOUNTER — Other Ambulatory Visit: Payer: Self-pay

## 2021-01-13 ENCOUNTER — Ambulatory Visit (INDEPENDENT_AMBULATORY_CARE_PROVIDER_SITE_OTHER): Payer: No Typology Code available for payment source | Admitting: Obstetrics

## 2021-01-13 ENCOUNTER — Encounter: Payer: Self-pay | Admitting: Obstetrics

## 2021-01-13 VITALS — BP 124/78 | HR 91 | Wt 177.0 lb

## 2021-01-13 DIAGNOSIS — Z3480 Encounter for supervision of other normal pregnancy, unspecified trimester: Secondary | ICD-10-CM | POA: Diagnosis present

## 2021-01-13 DIAGNOSIS — N926 Irregular menstruation, unspecified: Secondary | ICD-10-CM

## 2021-01-13 LAB — POCT URINE PREGNANCY: Preg Test, Ur: POSITIVE — AB

## 2021-01-13 NOTE — Progress Notes (Signed)
New Obstetric Patient H&P    Chief Complaint: "Desires prenatal care"   History of Present Illness: Patient is a 25 y.o. G2P0 Hispanic or Latino female, LMP 11/13/2020 presents with amenorrhea and positive home pregnancy test. Based on her  LMP, her EDD is Estimated Date of Delivery: 08/20/21 and her EGA is [redacted]w[redacted]d. Cycles are 5. days, regular, and occur approximately every : 28 days. Her last pap smear was about 1 years ago and was no abnormalities.    She had a urine pregnancy test which was positive about 3 week(s)  ago. Her last menstrual period was normal and lasted for  about 5 day(s). Since her LMP she claims she has experienced fatigue, breast tenderness,nausea and vomiting.. She denies vaginal bleeding. Her past medical history is noncontributory. Her prior pregnancies are notable for a miscarriage last year.  Since her LMP, she admits to the use of tobacco products  no She claims she has gained   no pounds since the start of her pregnancy.  There are cats in the home in the home  no She admits close contact with children on a regular basis  no  She has had chicken pox in the past yes She has had Tuberculosis exposures, symptoms, or previously tested positive for TB   no Current or past history of domestic violence. no  Genetic Screening/Teratology Counseling: (Includes patient, baby's father, or anyone in either family with:)   1. Patient's age >/= 44 at [facility]  no 2. Thalassemia ([place], [place], Mediterranean, or Asian background): MCV<80  no 3. Neural tube defect (meningomyelocele, spina bifida, anencephaly)  no 4. Congenital heart defect  no  5. Down syndrome  no 6. Tay-Sachs (Jewish, [place]  no 7. Canavan's Disease  no 8. Sickle cell disease or trait (African)  no  9. Hemophilia or other blood disorders  no  10. Muscular dystrophy  no  11. Cystic fibrosis  no  12. Huntington's Chorea  no  13. Mental retardation/autism  no 14. Other inherited genetic or  chromosomal disorder  no 15. Maternal metabolic disorder (DM, PKU, etc)  no 16. Patient or FOB with a child with a birth defect not listed above no  16a. Patient or FOB with a birth defect themselves no 17. Recurrent pregnancy loss, or stillbirth  no  18. Any medications since LMP other than prenatal vitamins (include vitamins, supplements, OTC meds, drugs, alcohol)  Yes- Valtrex for oral cold sores 19. Any other genetic/environmental exposure to discuss  no  Infection History:   1. Lives with someone with TB or TB exposed  no  2. Patient or partner has history of genital herpes  no 3. Rash or viral illness since LMP  no 4. History of STI (GC, CT, HPV, syphilis, HIV)  no 5. History of recent travel :  no  Other pertinent information:  no     Review of Systems:10 point review of systems negative unless otherwise noted in HPI  Past Medical History:  Past Medical History:  Diagnosis Date  . Anxiety   . Depression   . GERD (gastroesophageal reflux disease)     Past Surgical History:  Past Surgical History:  Procedure Laterality Date  . TONSILLECTOMY    . TONSILLECTOMY      Gynecologic History: Patient's last menstrual period was 11/13/2020.  Obstetric History: G2P0  Family History:  Family History  Problem Relation Age of Onset  . Depression Mother   . Depression Father  Social History:  Social History   Socioeconomic History  . Marital status: Single    Spouse name: Not on file  . Number of children: Not on file  . Years of education: Not on file  . Highest education level: Not on file  Occupational History  . Not on file  Tobacco Use  . Smoking status: Never  . Smokeless tobacco: Never  Vaping Use  . Vaping Use: Never used  Substance and Sexual Activity  . Alcohol use: Not Currently  . Drug use: Never  . Sexual activity: Not on file  Other Topics Concern  . Not on file  Social History Narrative  . Not on file   Social Determinants of Health    Financial Resource Strain: Not on file  Food Insecurity: Not on file  Transportation Needs: Not on file  Physical Activity: Not on file  Stress: Not on file  Social Connections: Not on file  Intimate Partner Violence: Not on file    Allergies:  No Known Allergies  Medications: Prior to Admission medications   Medication Sig Start Date End Date Taking? Authorizing Provider  amoxicillin-clavulanate (AUGMENTIN) 875-125 MG tablet Take 1 tablet by mouth 2 (two) times daily. 01/11/21  Yes [provider]  dextromethorphan (DELSYM) 30 MG/5ML liquid Take by mouth. 01/11/21  Yes [provider]  GNP SLEEP AID 25 MG tablet Take by mouth. 01/06/21  Yes [provider]  omeprazole (PRILOSEC) 20 MG capsule Take 1 capsule (20 mg total) by mouth daily. 12/22/20  Yes Baity, Salvadore Oxford, NP  albuterol (VENTOLIN HFA) 108 (90 Base) MCG/ACT inhaler Inhale 1-2 puffs into the lungs every 4 (four) hours as needed for wheezing or shortness of breath. 05/27/20   Domenick Gong, MD  ipratropium (ATROVENT) 0.06 % nasal spray Place 2 sprays into both nostrils 4 (four) times daily. 01/05/21   Delton See, MD    Physical Exam Vitals: Blood pressure 124/78, pulse 91, weight 177 lb (80.3 kg), last menstrual period 11/13/2020, unknown if currently breastfeeding.  General: NAD HEENT: normocephalic, anicteric Thyroid: no enlargement, no palpable nodules Pulmonary: No increased work of breathing, CTAB Cardiovascular: RRR, distal pulses 2+ Abdomen: NABS, soft, non-tender, non-distended.  Umbilicus without lesions.  No hepatomegaly, splenomegaly or masses palpable. No evidence of hernia  Genitourinary:  External: Normal external female genitalia.  Normal urethral meatus, normal  Bartholin's and Skene's glands.    Vagina: Normal vaginal mucosa, no evidence of prolapse.    Cervix: Grossly normal in appearance, no bleeding  Uterus: anteverted Non-enlarged, mobile, normal contour.  No  CMT  Adnexa: ovaries non-enlarged, no adnexal masses  Rectal: deferred Extremities: no edema, erythema, or tenderness Neurologic: Grossly intact Psychiatric: mood appropriate, affect full   Assessment: 25 y.o. G2P0 at [redacted]w[redacted]d presenting to initiate prenatal care  Plan: 1) Avoid alcoholic beverages. 2) Patient encouraged not to smoke.  3) Discontinue the use of all non-medicinal drugs and chemicals.  4) Take prenatal vitamins daily.  5) Nutrition, food safety (fish, cheese advisories, and high nitrite foods) and exercise discussed. 6) Hospital and practice style discussed with cross coverage system.  7) Genetic Screening, such as with 1st Trimester Screening, cell free fetal DNA, AFP testing, and Ultrasound, as well as with amniocentesis and CVS as appropriate, is discussed with patient. At the conclusion of today's visit patient undecided genetic testing 8) Patient is asked about travel to areas at risk for the Zika virus, and counseled to avoid travel and exposure to mosquitoes or sexual partners who  may have themselves been exposed to the virus. Testing is discussed, and will be ordered as appropriate.  Pap smear and cultures today. Blood work next visit. Dating scan in house requested for next visit.  Mirna Mires, CNM  01/13/2021 5:16 PM

## 2021-01-13 NOTE — Progress Notes (Signed)
NOB - nausea daily. RM 5

## 2021-01-15 LAB — URINE CULTURE: Organism ID, Bacteria: NO GROWTH

## 2021-01-16 ENCOUNTER — Other Ambulatory Visit: Payer: Self-pay | Admitting: Obstetrics

## 2021-01-16 DIAGNOSIS — O219 Vomiting of pregnancy, unspecified: Secondary | ICD-10-CM

## 2021-01-16 MED ORDER — ONDANSETRON 4 MG PO TBDP: 4.0000 mg | ORAL_TABLET | Freq: Four times a day (QID) | ORAL | 0 refills | Status: DC | PRN

## 2021-01-17 LAB — CYTOLOGY - PAP
Chlamydia: NEGATIVE
Comment: NEGATIVE
Comment: NEGATIVE
Comment: NORMAL
Diagnosis: NEGATIVE
Neisseria Gonorrhea: NEGATIVE
Trichomonas: NEGATIVE

## 2021-01-19 ENCOUNTER — Other Ambulatory Visit: Payer: Self-pay | Admitting: Internal Medicine

## 2021-01-19 NOTE — Telephone Encounter (Signed)
Requested Prescriptions  Pending Prescriptions Disp Refills  . omeprazole (PRILOSEC) 20 MG capsule [Pharmacy Med Name: OMEPRAZOLE DR 20 MG CAPSULE] 30 capsule 2    Sig: TAKE 1 CAPSULE BY MOUTH EVERY DAY     Gastroenterology: Proton Pump Inhibitors Passed - 01/19/2021  4:17 AM      Passed - Valid encounter within last 12 months    Recent Outpatient Visits          4 weeks ago Gastroesophageal reflux disease without esophagitis   [facility] [place], Salvadore Oxford, NP      Future Appointments            In 4 months Deirdre Evener, MD [facility]

## 2021-01-25 ENCOUNTER — Other Ambulatory Visit: Payer: Self-pay

## 2021-01-25 DIAGNOSIS — O219 Vomiting of pregnancy, unspecified: Secondary | ICD-10-CM

## 2021-01-26 ENCOUNTER — Other Ambulatory Visit: Payer: Self-pay

## 2021-01-26 DIAGNOSIS — O219 Vomiting of pregnancy, unspecified: Secondary | ICD-10-CM

## 2021-01-26 MED ORDER — ONDANSETRON 4 MG PO TBDP: 4.0000 mg | ORAL_TABLET | Freq: Four times a day (QID) | ORAL | 0 refills | Status: DC | PRN

## 2021-02-02 ENCOUNTER — Ambulatory Visit (INDEPENDENT_AMBULATORY_CARE_PROVIDER_SITE_OTHER): Payer: No Typology Code available for payment source | Admitting: Obstetrics and Gynecology

## 2021-02-02 ENCOUNTER — Other Ambulatory Visit: Payer: Self-pay

## 2021-02-02 VITALS — BP 100/60 | Wt 175.0 lb

## 2021-02-02 DIAGNOSIS — Z3A12 12 weeks gestation of pregnancy: Secondary | ICD-10-CM

## 2021-02-02 DIAGNOSIS — O219 Vomiting of pregnancy, unspecified: Secondary | ICD-10-CM

## 2021-02-02 DIAGNOSIS — Z3481 Encounter for supervision of other normal pregnancy, first trimester: Secondary | ICD-10-CM

## 2021-02-02 DIAGNOSIS — O210 Mild hyperemesis gravidarum: Secondary | ICD-10-CM | POA: Diagnosis not present

## 2021-02-02 DIAGNOSIS — Z3A11 11 weeks gestation of pregnancy: Secondary | ICD-10-CM | POA: Diagnosis not present

## 2021-02-02 NOTE — Progress Notes (Signed)
  Routine Prenatal Care Visit  Subjective  Tracey Hebert is a 25 y.o. G2P0 at [redacted]w[redacted]d being seen today for ongoing prenatal care.  She is currently monitored for the following issues for this low-risk pregnancy and has Gastroesophageal reflux disease without esophagitis; Rosacea; Reactive airway disease; Supervision of other normal pregnancy, antepartum; and Nausea and vomiting during pregnancy on their problem list.  ----------------------------------------------------------------------------------- Patient reports nausea.    . Vag. Bleeding: None.   . Leaking Fluid denies.  ----------------------------------------------------------------------------------- The following portions of the patient's history were reviewed and updated as appropriate: allergies, current medications, past family history, past medical history, past social history, past surgical history and problem list. Problem list updated.  Objective  Blood pressure 100/60, weight 175 lb (79.4 kg), last menstrual period 11/13/2020, unknown if currently breastfeeding. Pregravid weight 177 lb (80.3 kg) Total Weight Gain -2 lb (-0.907 kg) Urinalysis: Urine Protein    Urine Glucose    Fetal Status: Fetal Heart Rate (bpm): 161 ([place])         General:  Alert, oriented and cooperative. Patient is in no acute distress.  Skin: Skin is warm and dry. No rash noted.   Cardiovascular: Normal heart rate noted  Respiratory: Normal respiratory effort, no problems with respiration noted  Abdomen: Soft, gravid, appropriate for gestational age.       Pelvic:  Cervical exam deferred        Extremities: Normal range of motion.     Mental Status: Normal mood and affect. Normal behavior. Normal judgment and thought content.   Assessment   25 y.o. G2P0 at [redacted]w[redacted]d by  08/20/2021, by Last Menstrual Period presenting for routine prenatal visit  Plan   pregnancy 2 Problems (from 01/13/21 to present)     Problem Noted Resolved   Supervision  of other normal pregnancy, antepartum 01/13/2021 by Mirna Mires, CNM No   Overview Addendum 02/05/2021  2:20 PM by Conard Novak, MD     Nursing Staff Provider  Office Location  [facility] Dating  L=11  Language  English Anatomy US    Flu Vaccine   Genetic Screen  NIPS:   TDaP vaccine    Hgb A1C or  GTT Early : Third trimester :   Covid    LAB RESULTS   Rhogam   Blood Type     Feeding Plan  Antibody    Contraception  Rubella    Circumcision  RPR     Pediatrician   HBsAg     Support Person  HIV    Prenatal Classes  Varicella     GBS  (For PCN allergy, check sensitivities)   BTL Consent     VBAC Consent  Pap  01/13/2021 - NILM    Hgb Electro    Pelvis Tested  CF      SMA                    Preterm labor symptoms and general obstetric precautions including but not limited to vaginal bleeding, contractions, leaking of fluid and fetal movement were reviewed in detail with the patient. Please refer to After Visit Summary for other counseling recommendations.   U/S today verified EDD.   Rx for Zofran sent   Return in about 4 weeks (around 03/02/2021) for Routine Prenatal Appointment.   Thomasene Mohair, MD, [facility], [facility] 02/02/2021 4:23 PM

## 2021-02-05 ENCOUNTER — Encounter: Payer: Self-pay | Admitting: Obstetrics and Gynecology

## 2021-02-05 DIAGNOSIS — O219 Vomiting of pregnancy, unspecified: Secondary | ICD-10-CM | POA: Insufficient documentation

## 2021-02-05 MED ORDER — ONDANSETRON 4 MG PO TBDP: 4.0000 mg | ORAL_TABLET | Freq: Four times a day (QID) | ORAL | 1 refills | Status: DC | PRN

## 2021-02-05 NOTE — Progress Notes (Signed)
ULTRASOUND REPORT  Location: [facility] Date of Service: 02/05/2021   Indications:dating (transabdominal) Findings:  Singleton intrauterine pregnancy is visualized with a CRL consistent with [redacted]w[redacted]d gestation, giving an (U/S) EDD of 08/17/2021. The (U/S) EDD is consistent with the clinically established EDD of 08/20/2021.  FHR: 161 BPM CRL measurement: 53.1 mm Yolk sac is not visualized. Amnion: visualized and appears normal   Right Ovary is not normal in appearance. There is a simple-appearing cyst that measures 2.7 x 3.4 x 3.16 cm. Left Ovary is not visualized. Corpus luteal cyst:  is not visualized Survey of the adnexa demonstrates no adnexal masses. There is no free peritoneal fluid in the cul de sac.  Impression: 1. [redacted]w[redacted]d Viable Singleton Intrauterine pregnancy by U/S. 2. (U/S) EDD is consistent with Clinically established EDD of 08/20/2021 by LMP.  Recommendations: 1.Clinical correlation with the patient's History and Physical Exam. 2. Keep same EDD based on correlation to today's ultrasound dating.   There is a viable singleton gestation.  Detailed evaluation of the fetal anatomy is precluded by early gestational age.  It must be noted that a normal ultrasound particular at this early gestational age is unable to rule out fetal aneuploidy, risk of first trimester miscarriage, or anatomic birth defects.  I personally performed the ultrasound and interpreted the images.   Thomasene Mohair, MD, [facility], [facility] 02/05/2021 2:26 PM

## 2021-02-09 ENCOUNTER — Other Ambulatory Visit: Payer: No Typology Code available for payment source

## 2021-02-09 ENCOUNTER — Other Ambulatory Visit: Payer: Self-pay

## 2021-02-09 DIAGNOSIS — Z3480 Encounter for supervision of other normal pregnancy, unspecified trimester: Secondary | ICD-10-CM

## 2021-02-09 LAB — OB RESULTS CONSOLE HIV ANTIBODY (ROUTINE TESTING): HIV: NONREACTIVE

## 2021-02-09 LAB — OB RESULTS CONSOLE VARICELLA ZOSTER ANTIBODY, IGG: Varicella: IMMUNE

## 2021-02-09 LAB — OB RESULTS CONSOLE RUBELLA ANTIBODY, IGM: Rubella: IMMUNE

## 2021-02-11 LAB — RPR+RH+ABO+RUB AB+AB SCR+CB...
Antibody Screen: NEGATIVE
HIV Screen 4th Generation wRfx: NONREACTIVE
Hematocrit: 38.4 % (ref 34.0–46.6)
Hemoglobin: 12.5 g/dL (ref 11.1–15.9)
Hepatitis B Surface Ag: NEGATIVE
MCH: 27.6 pg (ref 26.6–33.0)
MCHC: 32.6 g/dL (ref 31.5–35.7)
MCV: 85 fL (ref 79–97)
Platelets: 355 10*3/uL (ref 150–450)
RBC: 4.53 x10E6/uL (ref 3.77–5.28)
RDW: 13.4 % (ref 11.7–15.4)
RPR Ser Ql: NONREACTIVE
Rh Factor: POSITIVE
Rubella Antibodies, IGG: 1.52 index (ref >0.99)
Varicella zoster IgG: 1299 index (ref >165)
WBC: 9.7 10*3/uL (ref 3.4–10.8)

## 2021-02-15 ENCOUNTER — Other Ambulatory Visit: Payer: Self-pay | Admitting: Obstetrics & Gynecology

## 2021-02-15 DIAGNOSIS — Z1379 Encounter for other screening for genetic and chromosomal anomalies: Secondary | ICD-10-CM

## 2021-02-15 NOTE — Telephone Encounter (Signed)
Sch lab appt

## 2021-02-15 NOTE — Telephone Encounter (Signed)
Called and left voicemail for patient to call back to be scheduled. 

## 2021-02-17 ENCOUNTER — Other Ambulatory Visit: Payer: No Typology Code available for payment source

## 2021-02-17 ENCOUNTER — Other Ambulatory Visit: Payer: Self-pay

## 2021-02-17 DIAGNOSIS — Z1379 Encounter for other screening for genetic and chromosomal anomalies: Secondary | ICD-10-CM

## 2021-02-23 LAB — MATERNIT21 PLUS CORE+SCA
Fetal Fraction: 11
Monosomy X (Turner Syndrome): NOT DETECTED
Result (T21): NEGATIVE
Trisomy 13 (Patau syndrome): NEGATIVE
Trisomy 18 (Edwards syndrome): NEGATIVE
Trisomy 21 (Down syndrome): NEGATIVE
XXX (Triple X Syndrome): NOT DETECTED
XXY (Klinefelter Syndrome): NOT DETECTED
XYY (Jacobs Syndrome): NOT DETECTED

## 2021-03-31 ENCOUNTER — Ambulatory Visit
Admission: RE | Admit: 2021-03-31 | Discharge: 2021-03-31 | Disposition: A | Discharge status: Home / Self Care | Payer: No Typology Code available for payment source | Source: Ambulatory Visit | Attending: Obstetrics and Gynecology | Admitting: Obstetrics and Gynecology

## 2021-03-31 DIAGNOSIS — Z3481 Encounter for supervision of other normal pregnancy, first trimester: Secondary | ICD-10-CM | POA: Insufficient documentation

## 2021-04-07 ENCOUNTER — Encounter: Payer: No Typology Code available for payment source | Admitting: Advanced Practice Midwife

## 2021-06-14 ENCOUNTER — Other Ambulatory Visit: Payer: Self-pay

## 2021-06-14 ENCOUNTER — Ambulatory Visit (INDEPENDENT_AMBULATORY_CARE_PROVIDER_SITE_OTHER): Payer: No Typology Code available for payment source | Admitting: Dermatology

## 2021-06-14 DIAGNOSIS — L719 Rosacea, unspecified: Secondary | ICD-10-CM

## 2021-06-14 DIAGNOSIS — R232 Flushing: Secondary | ICD-10-CM | POA: Diagnosis not present

## 2021-06-14 NOTE — Patient Instructions (Addendum)
Gentle Skin Care Guide ° °1. Bathe no more than once a day. ° °2. Avoid bathing in hot water ° °3. Use a mild soap like Dove, Vanicream, Cetaphil, CeraVe. Can use Lever 2000 or Cetaphil antibacterial soap ° °4. Use soap only where you need it. On most days, use it under your arms, between your legs, and on your feet. Let the water rinse other areas unless visibly dirty. ° °5. When you get out of the bath/shower, use a towel to gently blot your skin dry, don't rub it. ° °6. While your skin is still a little damp, apply a moisturizing cream such as Vanicream, CeraVe, Cetaphil, Eucerin, Sarna lotion or plain Vaseline Jelly. For hands apply Neutrogena Norwegian Hand Cream or Excipial Hand Cream. ° °7. Reapply moisturizer any time you start to itch or feel dry. ° °8. Sometimes using free and clear laundry detergents can be helpful. Fabric softener sheets should be avoided. Downy Free & Gentle liquid, or any liquid fabric softener that is free of dyes and perfumes, it acceptable to use ° °9. If your doctor has given you prescription creams you may apply moisturizers over them  ° ° ° ° ° ° ° °If You Need Anything After Your Visit ° °If you have any questions or concerns for your doctor, please call our main line at [phone] and press option 4 to reach your doctor's medical assistant. If no one answers, please leave a voicemail as directed and we will return your call as soon as possible. Messages left after 4 pm will be answered the following business day.  ° °You may also send us a message via MyChart. We typically respond to MyChart messages within 1-2 business days. ° °For prescription refills, please ask your pharmacy to contact our office. Our fax number is [phone]. ° °If you have an urgent issue when the clinic is closed that cannot wait until the next business day, you can page your doctor at the number below.   ° °Please note that while we do our best to be available for urgent issues outside of office  hours, we are not available 24/7.  ° °If you have an urgent issue and are unable to reach us, you may choose to seek medical care at your doctor's office, retail clinic, urgent care center, or emergency room. ° °If you have a medical emergency, please immediately call 911 or go to the emergency department. ° °Pager Numbers ° °- Dr. Kowalski: [phone] ° °- Dr. Moye: [phone] ° °- Dr. Stewart: [phone] ° °In the event of inclement weather, please call our main line at [phone] for an update on the status of any delays or closures. ° °Dermatology Medication Tips: °Please keep the boxes that topical medications come in in order to help keep track of the instructions about where and how to use these. Pharmacies typically print the medication instructions only on the boxes and not directly on the medication tubes.  ° °If your medication is too expensive, please contact our office at [phone] option 4 or send us a message through MyChart.  ° °We are unable to tell what your co-pay for medications will be in advance as this is different depending on your insurance coverage. However, we may be able to find a substitute medication at lower cost or fill out paperwork to get insurance to cover a needed medication.  ° °If a prior authorization is required to get your medication covered by your insurance company, please allow us 1-2   business days to complete this process. ° °Drug prices often vary depending on where the prescription is filled and some pharmacies may offer cheaper prices. ° °The website www.goodrx.com contains coupons for medications through different pharmacies. The prices here do not account for what the cost may be with help from insurance (it may be cheaper with your insurance), but the website can give you the price if you did not use any insurance.  °- You can print the associated coupon and take it with your prescription to the pharmacy.  °- You may also stop by our office during regular  business hours and pick up a GoodRx coupon card.  °- If you need your prescription sent electronically to a different pharmacy, notify our office through [facility] MyChart or by phone at [phone] option 4. ° ° ° ° °Si Usted Necesita Algo Después de Su Visita ° °También puede enviarnos un mensaje a través de MyChart. Por lo general respondemos a los mensajes de MyChart en el transcurso de 1 a 2 días hábiles. ° °Para renovar recetas, por favor pida a su farmacia que se ponga en contacto con nuestra oficina. Nuestro número de fax es el [phone]. ° °Si tiene un asunto urgente cuando la clínica esté cerrada y que no puede esperar hasta el siguiente día hábil, puede llamar/localizar a su doctor(a) al número que aparece a continuación.  ° °Por favor, tenga en cuenta que aunque hacemos todo lo posible para estar disponibles para asuntos urgentes fuera del horario de oficina, no estamos disponibles las 24 horas del día, los 7 días de la semana.  ° °Si tiene un problema urgente y no puede comunicarse con nosotros, puede optar por buscar atención médica  en el consultorio de su doctor(a), en una clínica privada, en un centro de atención urgente o en una sala de emergencias. ° °Si tiene una emergencia médica, por favor llame inmediatamente al 911 o vaya a la sala de emergencias. ° °Números de bíper ° °- Dr. Kowalski: [phone] ° °- Dra. Moye: [phone] ° °- Dra. Stewart: [phone] ° °En caso de inclemencias del tiempo, por favor llame a nuestra línea principal al [phone] para una actualización sobre el estado de cualquier retraso o cierre. ° °Consejos para la medicación en dermatología: °Por favor, guarde las cajas en las que vienen los medicamentos de uso tópico para ayudarle a seguir las instrucciones sobre dónde y cómo usarlos. Las farmacias generalmente imprimen las instrucciones del medicamento sólo en las cajas y no directamente en los tubos del medicamento.  ° °Si su medicamento es muy caro, por  favor, póngase en contacto con nuestra oficina llamando al [phone] y presione la opción 4 o envíenos un mensaje a través de MyChart.  ° °No podemos decirle cuál será su copago por los medicamentos por adelantado ya que esto es diferente dependiendo de la cobertura de su seguro. Sin embargo, es posible que podamos encontrar un medicamento sustituto a menor costo o llenar un formulario para que el seguro cubra el medicamento que se considera necesario.  ° °Si se requiere una autorización previa para que su compañía de seguros cubra su medicamento, por favor permítanos de 1 a 2 días hábiles para completar este proceso. ° °Los precios de los medicamentos varían con frecuencia dependiendo del lugar de dónde se surte la receta y alguna farmacias pueden ofrecer precios más baratos. ° °El sitio web www.goodrx.com tiene cupones para medicamentos de diferentes farmacias. Los precios aquí no tienen en cuenta lo que podría costar con   la ayuda del seguro (puede ser más barato con su seguro), pero el sitio web puede darle el precio si no utilizó ningún seguro.  °- Puede imprimir el cupón correspondiente y llevarlo con su receta a la farmacia.  °- También puede pasar por nuestra oficina durante el horario de atención regular y recoger una tarjeta de cupones de GoodRx.  °- Si necesita que su receta se envíe electrónicamente a una farmacia diferente, informe a nuestra oficina a través de MyChart de [place] o por teléfono llamando al [phone] y presione la opción 4.  °

## 2021-06-14 NOTE — Progress Notes (Signed)
° °  New Patient Visit  Subjective  Tracey Hebert is a 25 y.o. female who presents for the following: Rosacea (Pt c/o red cheeks for several years, past treatment Metro-gel no help ). Pt [redacted] weeks pregnant   The following portions of the chart were reviewed this encounter and updated as appropriate:   Tobacco   Allergies   Meds   Problems   Med Hx   Surg Hx   Fam Hx      Review of Systems:  No other skin or systemic complaints except as noted in HPI or Assessment and Plan.  Objective  Well appearing patient in no apparent distress; mood and affect are within normal limits.  A focused examination was performed including face. Relevant physical exam findings are noted in the Assessment and Plan.  face Mid face erythema with telangiectasias             Assessment & Plan  Rosacea with flushing face Rosacea is a chronic progressive skin condition usually affecting the face of adults, causing redness and/or acne bumps. It is treatable but not curable. It sometimes affects the eyes (ocular rosacea) as well. It may respond to topical and/or systemic medication and can flare with stress, sun exposure, alcohol, exercise and some foods.  Daily application of broad spectrum spf 30+ sunscreen to face is recommended to reduce flares.   Do not recommend any treatment due to patient being pregnant  After pregnancy may consider the treatment option of BBL/laser versus a trial of Rhofade or Mirvaso.    For laser/BBL, we typically recommend 1-3 treatment sessions about 5-8 weeks apart for best results.  The patient's condition may require "maintenance treatments" in the future.  The fee for BBL / laser treatments is $350 per treatment session for the whole face.  A fee can be quoted for other parts of the body. Insurance typically does not pay for BBL/laser treatments and therefore the fee is an out-of-pocket cost.   After pregnancy pt will call and we will send in Skin medicinals  Rosacea/Oxymetazoline Oxymetazoline/Ivermectin Pt will also let us know if she is breastfeeding   Return if symptoms worsen or fail to improve.  IAngelique Holm, CMA, am acting as scribe for Armida Sans, MD .  Documentation: I have reviewed the above documentation for accuracy and completeness, and I agree with the above.  Armida Sans, MD

## 2021-06-17 ENCOUNTER — Observation Stay
Admission: EM | Admit: 2021-06-17 | Discharge: 2021-06-18 | Disposition: A | Discharge status: Home / Self Care | Payer: No Typology Code available for payment source

## 2021-06-17 DIAGNOSIS — Z79899 Other long term (current) drug therapy: Secondary | ICD-10-CM | POA: Insufficient documentation

## 2021-06-17 DIAGNOSIS — O99891 Other specified diseases and conditions complicating pregnancy: Secondary | ICD-10-CM | POA: Diagnosis present

## 2021-06-17 DIAGNOSIS — Z3A31 31 weeks gestation of pregnancy: Secondary | ICD-10-CM | POA: Insufficient documentation

## 2021-06-17 DIAGNOSIS — O99513 Diseases of the respiratory system complicating pregnancy, third trimester: Secondary | ICD-10-CM | POA: Insufficient documentation

## 2021-06-17 DIAGNOSIS — M545 Low back pain, unspecified: Secondary | ICD-10-CM | POA: Insufficient documentation

## 2021-06-17 DIAGNOSIS — O26893 Other specified pregnancy related conditions, third trimester: Secondary | ICD-10-CM | POA: Diagnosis not present

## 2021-06-17 DIAGNOSIS — R1010 Upper abdominal pain, unspecified: Secondary | ICD-10-CM | POA: Insufficient documentation

## 2021-06-17 DIAGNOSIS — J45909 Unspecified asthma, uncomplicated: Secondary | ICD-10-CM | POA: Insufficient documentation

## 2021-06-17 DIAGNOSIS — M549 Dorsalgia, unspecified: Secondary | ICD-10-CM | POA: Diagnosis present

## 2021-06-17 DIAGNOSIS — K219 Gastro-esophageal reflux disease without esophagitis: Secondary | ICD-10-CM | POA: Insufficient documentation

## 2021-06-17 DIAGNOSIS — O99613 Diseases of the digestive system complicating pregnancy, third trimester: Secondary | ICD-10-CM | POA: Insufficient documentation

## 2021-06-17 DIAGNOSIS — Z3480 Encounter for supervision of other normal pregnancy, unspecified trimester: Secondary | ICD-10-CM

## 2021-06-17 MED ORDER — CALCIUM CARBONATE ANTACID 500 MG PO CHEW: 2.0000 | CHEWABLE_TABLET | ORAL | Status: DC | PRN

## 2021-06-17 MED ORDER — ACETAMINOPHEN 500 MG PO TABS
1000.0000 mg | ORAL_TABLET | Freq: Four times a day (QID) | ORAL | Status: DC | PRN
  Administered 2021-06-18: 01:00:00 1000 mg via ORAL
  Filled 2021-06-17: qty 2

## 2021-06-18 ENCOUNTER — Other Ambulatory Visit: Payer: Self-pay

## 2021-06-18 DIAGNOSIS — O26893 Other specified pregnancy related conditions, third trimester: Secondary | ICD-10-CM | POA: Diagnosis not present

## 2021-06-18 LAB — URINALYSIS, COMPLETE (UACMP) WITH MICROSCOPIC
Bilirubin Urine: NEGATIVE
Glucose, UA: NEGATIVE mg/dL
Ketones, ur: NEGATIVE mg/dL
Nitrite: NEGATIVE
Protein, ur: NEGATIVE mg/dL
Specific Gravity, Urine: 1.018 (ref 1.005–1.030)
WBC, UA: >50 WBC/hpf — ABNORMAL HIGH (ref 0–5)
pH: 6 (ref 5.0–8.0)

## 2021-06-18 MED ORDER — AMOXICILLIN-POT CLAVULANATE 875-125 MG PO TABS: 1.0000 | ORAL_TABLET | Freq: Two times a day (BID) | ORAL | 0 refills | Status: AC

## 2021-06-18 MED ORDER — FLUCONAZOLE 150 MG PO TABS: 150.0000 mg | ORAL_TABLET | ORAL | 0 refills | Status: AC

## 2021-06-18 NOTE — Discharge Summary (Signed)
Tracey Hebert is a 25 y.o. female. She is at [redacted]w[redacted]d gestation. Patient's last menstrual period was 11/13/2020. Estimated Date of Delivery: 08/20/21  [facility]  Chief complaint: lower back pain  HPI: Tracey Hebert presents to L&D with complaints of lower back pain.  Reports upper abdominal pain yesterday that was not relieved after bowel movement.  Felt better today but then started having lower back pain that became worse throughout the day.  Denies contractions, LOF, or vaginal bleeding.  Endorses good fetal movement.   Factors complicating pregnancy: Asthma  GERD  S: Resting comfortably. no CTX, no VB.no LOF,  Active fetal movement.   Maternal Medical History:  Past Medical Hx:  has a past medical history of Anxiety, Depression, and GERD (gastroesophageal reflux disease).    Past Surgical Hx:  has a past surgical history that includes Tonsillectomy and Tonsillectomy.   No Known Allergies   Prior to Admission medications   Medication Sig Start Date End Date Taking? Authorizing Provider  amoxicillin-clavulanate (AUGMENTIN) 875-125 MG tablet Take 1 tablet by mouth 2 (two) times daily for 5 days. 06/18/21 06/23/21 Yes Gustavo Lah, CNM  fluconazole (DIFLUCAN) 150 MG tablet Take 1 tablet (150 mg total) by mouth once a week for 2 doses. 06/18/21 06/26/21 Yes Gustavo Lah, CNM  albuterol (VENTOLIN HFA) 108 (90 Base) MCG/ACT inhaler Inhale 1-2 puffs into the lungs every 4 (four) hours as needed for wheezing or shortness of breath. 05/27/20   Domenick Gong, MD  GNP SLEEP AID 25 MG tablet Take by mouth. 01/06/21   [provider]  omeprazole (PRILOSEC) 20 MG capsule TAKE 1 CAPSULE BY MOUTH EVERY DAY 01/19/21   Lorre Munroe, NP  ondansetron (ZOFRAN ODT) 4 MG disintegrating tablet Take 1 tablet (4 mg total) by mouth every 6 (six) hours as needed for nausea. 02/05/21   Conard Novak, MD    Social History: She  reports that she has never smoked. She  has never used smokeless tobacco. She reports that she does not currently use alcohol. She reports that she does not use drugs.  Family History: family history includes Depression in her father and mother.   Review of Systems: A full review of systems was performed and negative except as noted in the HPI.    O:  BP 116/64    Pulse 82    Temp 97.6 F (36.4 C) (Oral)    Resp 18    Ht 5\' 4"  (1.626 m)    Wt 81.6 kg    LMP 11/13/2020 Comment: o   BMI 30.90 kg/m  Results for orders placed or performed during the hospital encounter of 06/17/21 (from the past 48 hour(s))  Urinalysis, Complete w Microscopic Urine, Clean Catch   Collection Time: 06/18/21 12:11 AM  Result Value Ref Range   Color, Urine YELLOW (A) YELLOW   APPearance CLOUDY (A) CLEAR   Specific Gravity, Urine 1.018 1.005 - 1.030   pH 6.0 5.0 - 8.0   Glucose, UA NEGATIVE NEGATIVE mg/dL   Hgb urine dipstick SMALL (A) NEGATIVE   Bilirubin Urine NEGATIVE NEGATIVE   Ketones, ur NEGATIVE NEGATIVE mg/dL   Protein, ur NEGATIVE NEGATIVE mg/dL   Nitrite NEGATIVE NEGATIVE   Leukocytes,Ua LARGE (A) NEGATIVE   RBC / HPF 6-10 0 - 5 RBC/hpf   WBC, UA >50 (H) 0 - 5 WBC/hpf   Bacteria, UA MANY (A) NONE SEEN   Squamous Epithelial / LPF 21-50 0 - 5   Mucus  PRESENT    Budding Yeast PRESENT       Constitutional: NAD, AAOx3  CV: RRR PULM: nl respiratory effort Abd: gravid, non-tender, non-distended, soft  Ext: Non-tender, Nonedmeatous Psych: mood appropriate, speech normal Pelvic : deferred  Fetal Monitor: Baseline: 130 bpm Variability: moderate Accels: Present Decels: none Toco: occasional, mild contractions   Category: I   Assessment: 25 y.o. [redacted]w[redacted]d here for antenatal surveillance during pregnancy.  Principle diagnosis: Back pain affecting pregnancy [O99.891, M54.9]    Plan: Labor: not present.  Fetal Wellbeing: Reassuring Cat 1 tracing. Reactive NST  UA c/w UTI - urine culture pending  Will start on antibiotics and  confirm with urine culture  Yeast seen on UA today - Rx for diflucan ordered  D/c home stable, precautions reviewed, follow-up as scheduled.   Meds ordered this encounter  Medications   calcium carbonate (TUMS - dosed in mg elemental calcium) chewable tablet 400 mg of elemental calcium   acetaminophen (TYLENOL) tablet 1,000 mg   fluconazole (DIFLUCAN) 150 MG tablet    Sig: Take 1 tablet (150 mg total) by mouth once a week for 2 doses.    Dispense:  2 tablet    Refill:  0    Order Specific Question:   Supervising Provider    Answer:   Hildred Laser [[id number]]   amoxicillin-clavulanate (AUGMENTIN) 875-125 MG tablet    Sig: Take 1 tablet by mouth 2 (two) times daily for 5 days.    Dispense:  10 tablet    Refill:  0    Order Specific Question:   Supervising Provider    Answer:   Hildred Laser [[id number]]      ----- Margaretmary Eddy, CNM Certified Nurse Midwife [facility]  Cl[facility] OB/GYN [facility]

## 2021-06-18 NOTE — OB Triage Note (Signed)
Pt arrives G2P0 at 31 weeks. Pt stated that she recently started seeing [facility] for her care. Pt also reports that upper abdominal pain started yesterday evening. Pt reports sharp pain which is 8/10. Pt states that she took stool softener at home and Tums and has had a BM with no relief.

## 2021-06-19 ENCOUNTER — Encounter: Payer: Self-pay | Admitting: Dermatology

## 2021-06-19 LAB — URINE CULTURE

## 2021-06-27 ENCOUNTER — Other Ambulatory Visit: Payer: Self-pay | Admitting: Family Medicine

## 2021-06-27 ENCOUNTER — Telehealth: Payer: Self-pay

## 2021-06-27 DIAGNOSIS — Z3689 Encounter for other specified antenatal screening: Secondary | ICD-10-CM

## 2021-06-27 NOTE — Telephone Encounter (Signed)
Called patient to notify of an appointment scheduled on 07/11/2021 at [facility] in [place].  Patient is aware of appointment.

## 2021-07-02 NOTE — L&D Delivery Note (Signed)
Delivery Note  First Stage: Labor onset: 2/22 at 1930 Augmentation: Pitocin Analgesia /Anesthesia intrapartum: epidural AROM 2/22 at 2321  Second Stage: Complete dilation 2/23 at 0945 Onset of pushing at 1015 FHR second stage Cat II- early and variable decels  Delivery of a viable female infant on 08/24/21 at 1216 by CNM delivery of fetal head in OA position with restitution to ROA. No nuchal cord;  Anterior then posterior shoulders delivered easily with gentle downward traction. Baby placed on mom's chest, and attended to by peds.  Cord double clamped after cessation of pulsation, cut by FOB  Third Stage: Placenta delivered spontaneously intact with 3VC @ 1221 Placenta disposition: routine disposal Uterine tone Firm / bleeding scant, Cytotec 871mcg PR given prophylactic due to prolonged active phase.   2nd deg laceration identified  Anesthesia for repair: epidural Repair: 2-0 Vicryl CT-1 Est. Blood Loss (mL): [id number]  Complications: none  Mom to postpartum.  Baby to Couplet care / Skin to Skin.  Newborn: Birth Weight: pending  Apgar Scores: 9/9 Feeding planned: breast and formula

## 2021-07-11 ENCOUNTER — Ambulatory Visit: Payer: No Typology Code available for payment source | Admitting: *Deleted

## 2021-07-11 ENCOUNTER — Ambulatory Visit: Payer: No Typology Code available for payment source | Attending: Family Medicine

## 2021-07-11 ENCOUNTER — Other Ambulatory Visit: Payer: Self-pay

## 2021-07-11 VITALS — BP 119/65 | HR 78

## 2021-07-11 DIAGNOSIS — Z3689 Encounter for other specified antenatal screening: Secondary | ICD-10-CM | POA: Diagnosis present

## 2021-07-11 DIAGNOSIS — Z3480 Encounter for supervision of other normal pregnancy, unspecified trimester: Secondary | ICD-10-CM

## 2021-07-11 DIAGNOSIS — O99213 Obesity complicating pregnancy, third trimester: Secondary | ICD-10-CM | POA: Diagnosis not present

## 2021-07-11 DIAGNOSIS — Z363 Encounter for antenatal screening for malformations: Secondary | ICD-10-CM | POA: Insufficient documentation

## 2021-07-11 DIAGNOSIS — Z3A34 34 weeks gestation of pregnancy: Secondary | ICD-10-CM | POA: Diagnosis not present

## 2021-07-29 ENCOUNTER — Other Ambulatory Visit: Payer: Self-pay

## 2021-07-29 ENCOUNTER — Encounter: Payer: Self-pay | Admitting: Obstetrics and Gynecology

## 2021-07-29 ENCOUNTER — Observation Stay
Admission: EM | Admit: 2021-07-29 | Discharge: 2021-07-29 | Disposition: A | Discharge status: Home / Self Care | Payer: No Typology Code available for payment source

## 2021-07-29 DIAGNOSIS — O26893 Other specified pregnancy related conditions, third trimester: Secondary | ICD-10-CM | POA: Diagnosis not present

## 2021-07-29 DIAGNOSIS — O99513 Diseases of the respiratory system complicating pregnancy, third trimester: Secondary | ICD-10-CM | POA: Insufficient documentation

## 2021-07-29 DIAGNOSIS — Z3A36 36 weeks gestation of pregnancy: Secondary | ICD-10-CM | POA: Insufficient documentation

## 2021-07-29 DIAGNOSIS — J45909 Unspecified asthma, uncomplicated: Secondary | ICD-10-CM | POA: Insufficient documentation

## 2021-07-29 DIAGNOSIS — O219 Vomiting of pregnancy, unspecified: Secondary | ICD-10-CM | POA: Diagnosis not present

## 2021-07-29 DIAGNOSIS — M545 Low back pain, unspecified: Secondary | ICD-10-CM | POA: Insufficient documentation

## 2021-07-29 DIAGNOSIS — O368130 Decreased fetal movements, third trimester, not applicable or unspecified: Secondary | ICD-10-CM | POA: Diagnosis present

## 2021-07-29 DIAGNOSIS — Z79899 Other long term (current) drug therapy: Secondary | ICD-10-CM | POA: Diagnosis not present

## 2021-07-29 LAB — URINALYSIS, COMPLETE (UACMP) WITH MICROSCOPIC
Bilirubin Urine: NEGATIVE
Glucose, UA: NEGATIVE mg/dL
Hgb urine dipstick: NEGATIVE
Nitrite: NEGATIVE
Protein, ur: 30 mg/dL — AB
Specific Gravity, Urine: 1.025 (ref 1.005–1.030)
WBC, UA: >50 WBC/hpf (ref 0–5)
pH: 6 (ref 5.0–8.0)

## 2021-07-29 MED ORDER — ONDANSETRON 4 MG PO TBDP
ORAL_TABLET | ORAL | Status: AC
  Filled 2021-07-29: qty 2

## 2021-07-29 MED ORDER — ONDANSETRON 4 MG PO TBDP: 8.0000 mg | ORAL_TABLET | Freq: Once | ORAL | Status: DC

## 2021-07-29 NOTE — Discharge Summary (Signed)
Tracey Hebert is a 26 y.o. female. She is at [redacted]w[redacted]d gestation. Patient's last menstrual period was 11/13/2020. Estimated Date of Delivery: 08/20/21  [facility]  Chief complaint: decreased fetal movement, n/v x 2, lower back pain   HPI: Tracey Hebert presents to L&D with complaints of decreased fetal movement since yesterday evening.  She reports abdominal pain, back pain, and pelvic pressure.  She also reports nausea with 2 episodes of emesis. She tried a stool softener and tums at home but it wasn't helpful.  Has not tried tylenol yet.  Deies regular contractions, LOF, or vaginal bleeding.   Factors complicating pregnancy: Asthma GERD  S: Resting comfortably. no CTX, no VB.no LOF, now feeling active fetal movement  Maternal Medical History:  Past Medical Hx:  has a past medical history of Anxiety, Depression, and GERD (gastroesophageal reflux disease).    Past Surgical Hx:  has a past surgical history that includes Tonsillectomy and Tonsillectomy.   No Known Allergies   Prior to Admission medications   Medication Sig Start Date End Date Taking? Authorizing Provider  omeprazole (PRILOSEC) 20 MG capsule TAKE 1 CAPSULE BY MOUTH EVERY DAY 01/19/21  Yes Baity, Coralie Keens, NP  Prenatal Vit-Fe Fumarate-FA (MULTIVITAMIN-PRENATAL) 27-0.8 MG TABS tablet Take 1 tablet by mouth daily at 12 noon.   Yes [provider]  albuterol (VENTOLIN HFA) 108 (90 Base) MCG/ACT inhaler Inhale 1-2 puffs into the lungs every 4 (four) hours as needed for wheezing or shortness of breath. 05/27/20   Melynda Ripple, MD  ondansetron (ZOFRAN ODT) 4 MG disintegrating tablet Take 1 tablet (4 mg total) by mouth every 6 (six) hours as needed for nausea. 02/05/21   Will Bonnet, MD    Social History: She  reports that she has never smoked. She has never used smokeless tobacco. She reports that she does not currently use alcohol. She reports that she does not use drugs.  Family  History: family history includes Depression in her father and mother; Hypertension in her father.   Review of Systems: A full review of systems was performed and negative except as noted in the HPI.    O:  BP 117/65 (BP Location: Right Arm)    Pulse 60    Temp 98.3 F (36.8 C) (Oral)    Resp 18    Ht 5\' 4"  (1.626 m)    Wt 86.6 kg    LMP 11/13/2020 Comment: o   BMI 32.79 kg/m  No results found for this or any previous visit (from the past 48 hour(s)).   Constitutional: NAD, AAOx3  PULM: nl respiratory effort Ext: Non-tender, Nonedmeatous Psych: mood appropriate, speech normal Pelvic : deferred  Fetal Monitor: Baseline: 130 bpm Variability: moderate Accels: Present Decels: none Toco: irregular mild contractions   Category: I   Assessment: 26 y.o. [redacted]w[redacted]d here for antenatal surveillance during pregnancy.  Principle diagnosis: GERD, back pain in pregnancy, reassuring fetal status    Plan: Labor: not present.  Fetal Wellbeing: Reassuring Cat 1 tracing. Reactive NST  Feeling better after rest and hydration -> back pain and abdominal pain significantly improved  UA pending  Reviewed early labor signs  Offered Zofran prior to discharge, she declined because she has this medication available at home  D/c home stable, precautions reviewed, follow-up as scheduled.   ----- Drinda Butts, CNM Certified Nurse Midwife [facility]

## 2021-07-29 NOTE — OB Triage Note (Signed)
Pt is a G2P0 at [redacted]w[redacted]d presenting to L&D triage c/o abdominal pain, back pain, pelvic pressure, and N/V. Pt states the pain began around midnight and rates it a 8/10. Pt states she took a stool softener and tums to help relieve this pain but it did not help. Pt denies taking tylenol. Pt describes the abdominal pain as sharp, the back pain as aching, and the pelvic pressure as a "lightening" feeling. Pt also states she had decreased FM. Monitors applied and assessing. VSS.

## 2021-07-30 LAB — URINE CULTURE: Culture: <10000 — AB

## 2021-08-04 LAB — OB RESULTS CONSOLE GC/CHLAMYDIA
Chlamydia: NEGATIVE
Gonorrhea: NEGATIVE

## 2021-08-04 LAB — OB RESULTS CONSOLE GBS: GBS: NEGATIVE

## 2021-08-09 ENCOUNTER — Observation Stay
Admission: EM | Admit: 2021-08-09 | Discharge: 2021-08-09 | Disposition: A | Discharge status: Home / Self Care | Payer: No Typology Code available for payment source | Attending: Obstetrics and Gynecology | Admitting: Obstetrics and Gynecology

## 2021-08-09 ENCOUNTER — Encounter: Payer: Self-pay | Admitting: Obstetrics and Gynecology

## 2021-08-09 DIAGNOSIS — O99513 Diseases of the respiratory system complicating pregnancy, third trimester: Secondary | ICD-10-CM | POA: Insufficient documentation

## 2021-08-09 DIAGNOSIS — Z3480 Encounter for supervision of other normal pregnancy, unspecified trimester: Secondary | ICD-10-CM

## 2021-08-09 DIAGNOSIS — Z3A36 36 weeks gestation of pregnancy: Secondary | ICD-10-CM | POA: Insufficient documentation

## 2021-08-09 DIAGNOSIS — N898 Other specified noninflammatory disorders of vagina: Secondary | ICD-10-CM | POA: Diagnosis not present

## 2021-08-09 DIAGNOSIS — O4290 Premature rupture of membranes, unspecified as to length of time between rupture and onset of labor, unspecified weeks of gestation: Secondary | ICD-10-CM | POA: Diagnosis present

## 2021-08-09 DIAGNOSIS — J45909 Unspecified asthma, uncomplicated: Secondary | ICD-10-CM | POA: Insufficient documentation

## 2021-08-09 DIAGNOSIS — O99891 Other specified diseases and conditions complicating pregnancy: Secondary | ICD-10-CM | POA: Diagnosis not present

## 2021-08-09 DIAGNOSIS — O4193X0 Disorder of amniotic fluid and membranes, unspecified, third trimester, not applicable or unspecified: Secondary | ICD-10-CM | POA: Diagnosis present

## 2021-08-09 LAB — WET PREP, GENITAL
Clue Cells Wet Prep HPF POC: NONE SEEN
Sperm: NONE SEEN
Trich, Wet Prep: NONE SEEN
WBC, Wet Prep HPF POC: <10 (ref <10)
Yeast Wet Prep HPF POC: NONE SEEN

## 2021-08-09 LAB — RUPTURE OF MEMBRANE (ROM)PLUS: Rom Plus: NEGATIVE

## 2021-08-09 MED ORDER — CALCIUM CARBONATE ANTACID 500 MG PO CHEW: 2.0000 | CHEWABLE_TABLET | ORAL | Status: DC | PRN

## 2021-08-09 MED ORDER — ACETAMINOPHEN 500 MG PO TABS: 1000.0000 mg | ORAL_TABLET | Freq: Four times a day (QID) | ORAL | Status: DC | PRN

## 2021-08-09 NOTE — OB Triage Note (Signed)
Discharge instructions, labor precautions, and follow-up care reviewed with patient and significant other. All questions answered. Patient verbalized understanding.

## 2021-08-09 NOTE — Discharge Summary (Signed)
Tracey Hebert is a 26 y.o. female. She is at [redacted]w[redacted]d gestation. Patient's last menstrual period was 11/13/2020. Estimated Date of Delivery: 08/20/21  [facility] care [facility]  Chief complaint: leakage of clear fluid.  HPI: Tracey Hebert presents to L&D with complaints leaking clear fluid.  She was seen for a schedule prenatal visit today at [facility] and reported a change in her vaginal discharge.  States that she usually has white vaginal discharge and it recently changed to clear over the past couple of days.  Reports that Monday she had some leaking and she's had several episodes since.  She has not needed a peripad and states the amounts are small.  Nitrazine was positive today but no ferning was seen at the office.  She was sent for evaluation of possible ruptured membranes.   Factors complicating pregnancy: Asthma GERD  S: Resting comfortably. no CTX, no VB.no current LOF, now feeling active fetal movement  Maternal Medical History:  Past Medical Hx:  has a past medical history of Anxiety, Depression, and GERD (gastroesophageal reflux disease).    Past Surgical Hx:  has a past surgical history that includes Tonsillectomy and Tonsillectomy.   No Known Allergies   Prior to Admission medications   Medication Sig Start Date End Date Taking? Authorizing Provider  omeprazole (PRILOSEC) 20 MG capsule TAKE 1 CAPSULE BY MOUTH EVERY DAY 01/19/21  Yes Baity, Salvadore Oxford, NP  Prenatal Vit-Fe Fumarate-FA (MULTIVITAMIN-PRENATAL) 27-0.8 MG TABS tablet Take 1 tablet by mouth daily at 12 noon.   Yes [provider]  albuterol (VENTOLIN HFA) 108 (90 Base) MCG/ACT inhaler Inhale 1-2 puffs into the lungs every 4 (four) hours as needed for wheezing or shortness of breath. 05/27/20   Domenick Gong, MD  ondansetron (ZOFRAN ODT) 4 MG disintegrating tablet Take 1 tablet (4 mg total) by mouth every 6 (six) hours as needed for nausea. 02/05/21   Conard Novak, MD    Social History:  She  reports that she has never smoked. She has never used smokeless tobacco. She reports that she does not currently use alcohol. She reports that she does not use drugs.  Family History: family history includes Depression in her father and mother; Hypertension in her father.   Review of Systems: A full review of systems was performed and negative except as noted in the HPI.    O:  BP 117/74 (BP Location: Right Arm)    Pulse 82    Temp 98.2 F (36.8 C) (Oral)    Resp 15    Ht 5\' 4"  (1.626 m)    Wt 88 kg    LMP 11/13/2020 Comment: o   BMI 33.30 kg/m  Results for orders placed or performed during the hospital encounter of 08/09/21 (from the past 48 hour(s))  Wet prep, genital   Collection Time: 08/09/21  4:37 PM  Result Value Ref Range   Yeast Wet Prep HPF POC NONE SEEN NONE SEEN   Trich, Wet Prep NONE SEEN NONE SEEN   Clue Cells Wet Prep HPF POC NONE SEEN NONE SEEN   WBC, Wet Prep HPF POC <10 <10   Sperm NONE SEEN   ROM Plus ([facility] only)   Collection Time: 08/09/21  4:37 PM  Result Value Ref Range   Rom Plus NEGATIVE      Constitutional: NAD, AAOx3  PULM: nl respiratory effort Ext: Non-tender, Nonedmeatous Psych: mood appropriate, speech normal Pelvic : deferred  Fetal Monitor: Baseline: 125 bpm Variability: moderate Accels: Present Decels:  none Toco: irregular mild contractions   Category: I NST: Reactive   Bedside US performed: Presentation: cephalic Placenta: anterior  AFI total 11.2 cm Q1: 3.18cm Q2: 4.37cm Q3: 1.61cm Q4: 2.06cm   Assessment: 26 y.o. [redacted]w[redacted]d here for antenatal surveillance during pregnancy.  Principle diagnosis: Vaginal discharge in pregnancy, rupture of membranes not found   Plan: Labor: not present.  Fetal Wellbeing: Reassuring Cat 1 tracing. Reactive NST  Rom + negative and AFI reassuring   Reviewed early labor signs  D/c home stable, precautions reviewed, follow-up as scheduled.   ----- Margaretmary Eddy, CNM Certified Nurse  Midwife [place]  [facility] OB[facility]

## 2021-08-09 NOTE — OB Triage Note (Signed)
Patient is a G2P0010 at [redacted]w[redacted]d who was sent from [facility] for possible ROM. Reports +FM, occasional Braxton Hicks ctx, and denies vaginal bleeding. External monitors and assessing. Initial FHT 135.

## 2021-08-20 ENCOUNTER — Inpatient Hospital Stay: Admit: 2021-08-20 | Payer: Self-pay

## 2021-08-21 ENCOUNTER — Other Ambulatory Visit: Payer: Self-pay

## 2021-08-21 DIAGNOSIS — Z3493 Encounter for supervision of normal pregnancy, unspecified, third trimester: Secondary | ICD-10-CM | POA: Insufficient documentation

## 2021-08-21 NOTE — Progress Notes (Signed)
G2P0010 at [redacted]w[redacted]d, LMP of 11/13/2020, c/w early [place] at [redacted]w[redacted]d.  Scheduled for induction of labor for postdates on 08/26/2021 at 0001.   [facility] provider: [facility] Pregnancy complicated by: Asthma GERD  Prenatal Labs: Blood type/Rh AB pos  Antibody screen neg  Rubella Unknown  Varicella Unknown   RPR NR  HBsAg Neg  HIV NR  GC neg  Chlamydia neg  Genetic screening Declined   1 hour GTT 138  3 hour GTT 79, 133, 107, 92  GBS Negative    Tdap: given 06/07/2021 Flu: given 04/05/2021 Contraception: condoms  Feeding preference: breast   ____ Margaretmary Eddy, CNM Certified Nurse Midwife [place]  [facility] OB[facility]

## 2021-08-23 ENCOUNTER — Inpatient Hospital Stay: Payer: No Typology Code available for payment source | Admitting: Anesthesiology

## 2021-08-23 ENCOUNTER — Inpatient Hospital Stay
Admission: EM | Admit: 2021-08-23 | Discharge: 2021-08-25 | DRG: 807 | Disposition: A | Discharge status: Home / Self Care | Payer: No Typology Code available for payment source | Attending: Obstetrics | Admitting: Obstetrics

## 2021-08-23 ENCOUNTER — Other Ambulatory Visit: Payer: Self-pay

## 2021-08-23 DIAGNOSIS — D509 Iron deficiency anemia, unspecified: Secondary | ICD-10-CM | POA: Diagnosis present

## 2021-08-23 DIAGNOSIS — Z20822 Contact with and (suspected) exposure to covid-19: Secondary | ICD-10-CM | POA: Diagnosis present

## 2021-08-23 DIAGNOSIS — O9952 Diseases of the respiratory system complicating childbirth: Secondary | ICD-10-CM | POA: Diagnosis present

## 2021-08-23 DIAGNOSIS — O9962 Diseases of the digestive system complicating childbirth: Secondary | ICD-10-CM | POA: Diagnosis present

## 2021-08-23 DIAGNOSIS — O480 Post-term pregnancy: Principal | ICD-10-CM | POA: Diagnosis present

## 2021-08-23 DIAGNOSIS — K219 Gastro-esophageal reflux disease without esophagitis: Secondary | ICD-10-CM | POA: Diagnosis present

## 2021-08-23 DIAGNOSIS — J45909 Unspecified asthma, uncomplicated: Secondary | ICD-10-CM | POA: Diagnosis present

## 2021-08-23 DIAGNOSIS — O26893 Other specified pregnancy related conditions, third trimester: Secondary | ICD-10-CM | POA: Diagnosis present

## 2021-08-23 DIAGNOSIS — O9902 Anemia complicating childbirth: Secondary | ICD-10-CM | POA: Diagnosis present

## 2021-08-23 DIAGNOSIS — Z3A40 40 weeks gestation of pregnancy: Secondary | ICD-10-CM | POA: Diagnosis not present

## 2021-08-23 LAB — CBC
HCT: 31.2 % — ABNORMAL LOW (ref 36.0–46.0)
Hemoglobin: 9.7 g/dL — ABNORMAL LOW (ref 12.0–15.0)
MCH: 23.9 pg — ABNORMAL LOW (ref 26.0–34.0)
MCHC: 31.1 g/dL (ref 30.0–36.0)
MCV: 76.8 fL — ABNORMAL LOW (ref 80.0–100.0)
Platelets: 276 10*3/uL (ref 150–400)
RBC: 4.06 MIL/uL (ref 3.87–5.11)
RDW: 14.7 % (ref 11.5–15.5)
WBC: 10.2 10*3/uL (ref 4.0–10.5)
nRBC: 0.2 % (ref 0.0–0.2)

## 2021-08-23 MED ORDER — SOD CITRATE-CITRIC ACID 500-334 MG/5ML PO SOLN
30.0000 mL | ORAL | Status: DC | PRN
  Administered 2021-08-24: 30 mL via ORAL

## 2021-08-23 MED ORDER — LACTATED RINGERS IV SOLN: 500.0000 mL | Freq: Once | INTRAVENOUS | Status: DC

## 2021-08-23 MED ORDER — ACETAMINOPHEN 500 MG PO TABS
ORAL_TABLET | ORAL | Status: AC
  Administered 2021-08-23: 1000 mg via ORAL
  Filled 2021-08-23: qty 2

## 2021-08-23 MED ORDER — DIPHENHYDRAMINE HCL 50 MG/ML IJ SOLN: 12.5000 mg | INTRAMUSCULAR | Status: DC | PRN

## 2021-08-23 MED ORDER — MISOPROSTOL 25 MCG QUARTER TABLET: 25.0000 ug | ORAL_TABLET | ORAL | Status: DC | PRN

## 2021-08-23 MED ORDER — LACTATED RINGERS IV SOLN: INTRAVENOUS | Status: DC

## 2021-08-23 MED ORDER — EPHEDRINE 5 MG/ML INJ: 10.0000 mg | INTRAVENOUS | Status: DC | PRN

## 2021-08-23 MED ORDER — BUTORPHANOL TARTRATE 1 MG/ML IJ SOLN: 1.0000 mg | INTRAMUSCULAR | Status: DC | PRN

## 2021-08-23 MED ORDER — ONDANSETRON HCL 4 MG/2ML IJ SOLN
4.0000 mg | Freq: Four times a day (QID) | INTRAMUSCULAR | Status: DC | PRN
  Administered 2021-08-24 (×2): 4 mg via INTRAVENOUS
  Filled 2021-08-23 (×2): qty 2

## 2021-08-23 MED ORDER — EPHEDRINE 5 MG/ML INJ
10.0000 mg | INTRAVENOUS | Status: DC | PRN
Start: 2021-08-23 — End: 2021-08-24

## 2021-08-23 MED ORDER — LIDOCAINE HCL (PF) 1 % IJ SOLN
INTRAMUSCULAR | Status: DC | PRN
  Administered 2021-08-23: 3 mL via SUBCUTANEOUS

## 2021-08-23 MED ORDER — FENTANYL-BUPIVACAINE-NACL 0.5-0.125-0.9 MG/250ML-% EP SOLN
EPIDURAL | Status: AC
  Filled 2021-08-23: qty 250

## 2021-08-23 MED ORDER — TERBUTALINE SULFATE 1 MG/ML IJ SOLN: 0.2500 mg | Freq: Once | INTRAMUSCULAR | Status: DC | PRN

## 2021-08-23 MED ORDER — ZOLPIDEM TARTRATE 5 MG PO TABS: 5.0000 mg | ORAL_TABLET | Freq: Every evening | ORAL | Status: DC | PRN

## 2021-08-23 MED ORDER — ACETAMINOPHEN 500 MG PO TABS: 1000.0000 mg | ORAL_TABLET | ORAL | Status: DC | PRN

## 2021-08-23 MED ORDER — OXYTOCIN BOLUS FROM INFUSION
333.0000 mL | Freq: Once | INTRAVENOUS | Status: AC
  Administered 2021-08-24: 333 mL via INTRAVENOUS

## 2021-08-23 MED ORDER — LIDOCAINE-EPINEPHRINE (PF) 1.5 %-1:200000 IJ SOLN
INTRAMUSCULAR | Status: DC | PRN
  Administered 2021-08-23: 3 mL via EPIDURAL

## 2021-08-23 MED ORDER — FENTANYL-BUPIVACAINE-NACL 0.5-0.125-0.9 MG/250ML-% EP SOLN
12.0000 mL/h | EPIDURAL | Status: DC | PRN
  Administered 2021-08-23: 12 mL/h via EPIDURAL

## 2021-08-23 MED ORDER — LIDOCAINE HCL (PF) 1 % IJ SOLN: 30.0000 mL | INTRAMUSCULAR | Status: DC | PRN

## 2021-08-23 MED ORDER — OXYTOCIN-SODIUM CHLORIDE 30-0.9 UT/500ML-% IV SOLN
1.0000 m[IU]/min | INTRAVENOUS | Status: DC
  Administered 2021-08-24: 2 m[IU]/min via INTRAVENOUS
  Filled 2021-08-23: qty 500

## 2021-08-23 MED ORDER — LACTATED RINGERS IV SOLN
500.0000 mL | INTRAVENOUS | Status: DC | PRN
  Administered 2021-08-23: 500 mL via INTRAVENOUS

## 2021-08-23 MED ORDER — OXYTOCIN-SODIUM CHLORIDE 30-0.9 UT/500ML-% IV SOLN: 2.5000 [IU]/h | INTRAVENOUS | Status: DC

## 2021-08-23 MED ORDER — LACTATED RINGERS IV BOLUS
1000.0000 mL | Freq: Once | INTRAVENOUS | Status: AC
  Administered 2021-08-23: 1000 mL via INTRAVENOUS

## 2021-08-23 MED ORDER — PHENYLEPHRINE 40 MCG/ML (10ML) SYRINGE FOR IV PUSH (FOR BLOOD PRESSURE SUPPORT)
80.0000 ug | PREFILLED_SYRINGE | INTRAVENOUS | Status: DC | PRN
Start: 2021-08-23 — End: 2021-08-24

## 2021-08-23 MED ORDER — ACETAMINOPHEN 325 MG PO TABS: 650.0000 mg | ORAL_TABLET | ORAL | Status: DC | PRN

## 2021-08-23 MED ORDER — SODIUM CHLORIDE 0.9 % IV SOLN
INTRAVENOUS | Status: DC | PRN
  Administered 2021-08-23: 8 mL via EPIDURAL

## 2021-08-23 MED ORDER — PHENYLEPHRINE 40 MCG/ML (10ML) SYRINGE FOR IV PUSH (FOR BLOOD PRESSURE SUPPORT): 80.0000 ug | PREFILLED_SYRINGE | INTRAVENOUS | Status: DC | PRN

## 2021-08-23 MED ORDER — ACETAMINOPHEN 500 MG PO TABS: 1000.0000 mg | ORAL_TABLET | Freq: Four times a day (QID) | ORAL | Status: DC | PRN

## 2021-08-23 NOTE — Anesthesia Preprocedure Evaluation (Signed)
Anesthesia Evaluation  Patient identified by MRN, date of birth, ID band Patient awake    Reviewed: Allergy & Precautions, NPO status , Patient's Chart, lab work & pertinent test results  History of Anesthesia Complications Negative for: history of anesthetic complications  Airway Mallampati: II  TM Distance: >3 FB Neck ROM: Full    Dental no notable dental hx. (+) Teeth Intact   Pulmonary asthma , neg sleep apnea, neg COPD, Patient abstained from smoking.Not current smoker,  Mild asthma, takes inhalers few times a year   Pulmonary exam normal breath sounds clear to auscultation       Cardiovascular Exercise Tolerance: Good METS(-) hypertension(-) CAD and (-) Past MI negative cardio ROS  (-) dysrhythmias  Rhythm:Regular Rate:Normal - Systolic murmurs    Neuro/Psych PSYCHIATRIC DISORDERS Anxiety Depression negative neurological ROS     GI/Hepatic neg GERD  ,(+)     (-) substance abuse  ,   Endo/Other  neg diabetes  Renal/GU negative Renal ROS     Musculoskeletal   Abdominal   Peds  Hematology   Anesthesia Other Findings Past Medical History: No date: Anxiety No date: Depression No date: GERD (gastroesophageal reflux disease)  Reproductive/Obstetrics (+) Pregnancy                             Anesthesia Physical Anesthesia Plan  ASA: 2  Anesthesia Plan: Epidural   Post-op Pain Management:    Induction:   PONV Risk Score and Plan: 2 and Treatment may vary due to age or medical condition and Ondansetron  Airway Management Planned: Natural Airway  Additional Equipment:   Intra-op Plan:   Post-operative Plan:   Informed Consent: I have reviewed the patients History and Physical, chart, labs and discussed the procedure including the risks, benefits and alternatives for the proposed anesthesia with the patient or authorized representative who has indicated his/her  understanding and acceptance.       Plan Discussed with: Surgeon  Anesthesia Plan Comments: (Discussed R/B/A of neuraxial anesthesia technique with patient: - rare risks of spinal/epidural hematoma, nerve damage, infection - Risk of PDPH - Risk of itching - Risk of nausea and vomiting - Risk of poor block necessitating replacement of epidural. - Risk of allergic reactions. Patient voiced understanding.)        Anesthesia Quick Evaluation

## 2021-08-23 NOTE — H&P (Signed)
OB History & Physical   History of Present Illness:   Chief Complaint: contractions  HPI:  Tracey Hebert is a 26 y.o. G2P0010 female at [redacted]w[redacted]d dated by LMP.  She presents to L&D for uterine contractions  Reports active fetal movement  Contractions: every 6 to 8 minutes LOF/SROM: intact Vaginal bleeding: none, brown discharge present  Factors complicating pregnancy:  Asthma GERD  Patient Active Problem List   Diagnosis Date Noted   Indication for care in labor and delivery, antepartum 08/23/2021   Indication for care in labor or delivery 08/21/2021   Leakage of amniotic fluid 08/09/2021   Supervision of other normal pregnancy, antepartum 01/13/2021   Gastroesophageal reflux disease without esophagitis 12/22/2020   Rosacea 12/22/2020   Reactive airway disease 12/22/2020     Maternal Medical History:   Past Medical History:  Diagnosis Date   Anxiety    Depression    GERD (gastroesophageal reflux disease)     Past Surgical History:  Procedure Laterality Date   TONSILLECTOMY     TONSILLECTOMY      No Known Allergies  Prior to Admission medications   Medication Sig Start Date End Date Taking? Authorizing Provider  omeprazole (PRILOSEC) 20 MG capsule TAKE 1 CAPSULE BY MOUTH EVERY DAY 01/19/21  Yes Baity, Coralie Keens, NP  Prenatal Vit-Fe Fumarate-FA (MULTIVITAMIN-PRENATAL) 27-0.8 MG TABS tablet Take 1 tablet by mouth daily at 12 noon.   Yes [provider]  albuterol (VENTOLIN HFA) 108 (90 Base) MCG/ACT inhaler Inhale 1-2 puffs into the lungs every 4 (four) hours as needed for wheezing or shortness of breath. 05/27/20   Melynda Ripple, MD  ondansetron (ZOFRAN ODT) 4 MG disintegrating tablet Take 1 tablet (4 mg total) by mouth every 6 (six) hours as needed for nausea. Patient not taking: Reported on 08/23/2021 02/05/21   Will Bonnet, MD     [facility] care [facility]  Social History: She  reports that she has never smoked. She has never  used smokeless tobacco. She reports that she does not currently use alcohol. She reports that she does not use drugs.  Family History: family history includes Depression in her father and mother; Hypertension in her father.   Review of Systems: A full review of systems was performed and negative except as noted in the HPI.     Physical Exam:  Vital Signs: BP (!) 100/59 (BP Location: Right Arm)    Pulse 80    Temp 99 F (37.2 C) (Oral)    Ht 5\' 4"  (1.626 m)    Wt 87.5 kg    LMP 11/13/2020 Comment: o   BMI 33.13 kg/m  Physical Exam Constitutional:      Appearance: Normal appearance.  HENT:     Head: Normocephalic.  Cardiovascular:     Rate and Rhythm: Normal rate and regular rhythm.  Pulmonary:     Effort: Pulmonary effort is normal.     Breath sounds: Normal breath sounds.  Abdominal:     Palpations: Abdomen is soft.     Comments: gravid  Genitourinary:    General: Normal vulva.     Vagina: Normal.     Cervix: Dilated.  Musculoskeletal:        General: Normal range of motion.     Cervical back: Normal range of motion and neck supple.  Neurological:     Mental Status: She is alert and oriented to person, place, and time.  Psychiatric:        Mood  and Affect: Mood normal.        Behavior: Behavior normal.        Thought Content: Thought content normal.        Judgment: Judgment normal.    General: no acute distress.  HEENT: normocephalic, atraumatic Heart: regular rate & rhythm.  No murmurs/rubs/gallops Lungs: clear to auscultation bilaterally, normal respiratory effort Abdomen: soft, gravid, non-tender;   Pelvic:   External: Normal external female genitalia  Cervix: Dilation: 4.5 / Effacement (%): 80 / Station: -1    Extremities: non-tender, symmetric, no edema bilaterally.  DTRs: +2  Neurologic: Alert & oriented x 3.    No results found for this or any previous visit (from the past 24 hour(s)).  Pertinent Results:  Prenatal Labs: Blood type/Rh AB Pos  Antibody  screen neg  Rubella unknown  Varicella unknown  RPR NR  HBsAg Neg  HIV NR  GC neg  Chlamydia neg  Genetic screening declined  1 hour GTT 138  3 hour GTT 79, 133, 107, 92  GBS Negative   FHT:  FHR: 120 bpm, variability: moderate,  accelerations:  Present,  decelerations:  Absent Category/reactivity:  Category I UC:   regular, every 6-8 minutes   Cephalic by [place] and SVE   No results found.  Assessment:  Tracey Hebert is a 26 y.o. G2P0010 female at [redacted]w[redacted]d with Asthma and GERD.   Plan:  1. Admit to Labor & Delivery; consents reviewed and obtained - Covid admission screen   2. Fetal Well being  - Fetal Tracing: Category 1 - Group B Streptococcus ppx not indicated: GBS neg - Presentation: Cephalic confirmed by SVE   3. Routine OB: - Prenatal labs reviewed, as above - Rh Pos - CBC, T&S, RPR on admit - Clear fluids, IVF  4. Monitoring of labor  - Contractions monitored with external toco - adequate for trial of labor  - Plan for expectant management  - Augmentation with oxytocin and AROM as appropriate  - Plan for  continuous fetal monitoring - Maternal pain control as desired; planning regional anesthesia - Anticipate vaginal delivery  5. Post Partum Planning: - Infant feeding: Breast - Contraception: Condoms - Tdap vaccine: 06/07/21 - Flu vaccine: 0000000    Avelino Leeds, CNM Certified Nurse Midwife [facility]

## 2021-08-23 NOTE — Anesthesia Procedure Notes (Signed)
Epidural Patient location during procedure: OB Start time: 08/23/2021 11:28 PM End time: 08/23/2021 11:48 PM  Staffing Anesthesiologist: Corinda Gubler, MD Performed: anesthesiologist   Preanesthetic Checklist Completed: patient identified, IV checked, site marked, risks and benefits discussed, surgical consent, monitors and equipment checked, pre-op evaluation and timeout performed  Epidural Patient position: sitting Prep: ChloraPrep Patient monitoring: heart rate, continuous pulse ox and blood pressure Approach: midline Location: L3-L4 Injection technique: LOR saline  Needle:  Needle type: Tuohy  Needle gauge: 17 G Needle length: 9 cm Needle insertion depth: 5 cm Catheter type: closed end Catheter size: 19 Gauge Catheter at skin depth: 10 cm Test dose: negative and 1.5% lidocaine with Epi 1:200 K  Assessment Sensory level: T10 Events: blood not aspirated, injection not painful, no injection resistance, no paresthesia and negative IV test  Additional Notes first attempt Pt. Evaluated and documentation done after procedure finished. Patient identified. Risks/Benefits/Options discussed with patient including but not limited to bleeding, infection, nerve damage, paralysis, failed block, incomplete pain control, headache, blood pressure changes, nausea, vomiting, reactions to medication both or allergic, itching and postpartum back pain. Confirmed with bedside nurse the patient's most recent platelet count. Confirmed with patient that they are not currently taking any anticoagulation, have any bleeding history or any family history of bleeding disorders. Patient expressed understanding and wished to proceed. All questions were answered. Sterile technique was used throughout the entire procedure. Please see nursing notes for vital signs. Test dose was given through epidural catheter and negative prior to continuing to dose epidural or start infusion. Warning signs of high block given to  the patient including shortness of breath, tingling/numbness in hands, complete motor block, or any concerning symptoms with instructions to call for help. Patient was given instructions on fall risk and not to get out of bed. All questions and concerns addressed with instructions to call with any issues or inadequate analgesia.     Patient tolerated the insertion well without immediate complications.  Reason for block: procedure for painReason for block:procedure for pain

## 2021-08-23 NOTE — OB Triage Note (Signed)
Pt is a G2P0 at [redacted]w[redacted]d presenting to L&D triage c/o ctx pain and vaginal bleeding. Ctx pain started last night and the vaginal bleeding began this morning. Pt states she is feeling the pain in her back. Pt is rating the pain 5/10. Pt denies LOF. +FM. VSS stable. Monitors applied and assessing.

## 2021-08-24 LAB — TYPE AND SCREEN
ABO/RH(D): AB POS
Antibody Screen: NEGATIVE

## 2021-08-24 LAB — RESP PANEL BY RT-PCR (FLU A&B, COVID) ARPGX2
Influenza A by PCR: NEGATIVE
Influenza B by PCR: NEGATIVE
SARS Coronavirus 2 by RT PCR: NEGATIVE

## 2021-08-24 LAB — RPR: RPR Ser Ql: NONREACTIVE

## 2021-08-24 MED ORDER — ONDANSETRON HCL 4 MG PO TABS
ORAL_TABLET | ORAL | Status: AC
  Filled 2021-08-24: qty 1

## 2021-08-24 MED ORDER — ONDANSETRON HCL 4 MG PO TABS
4.0000 mg | ORAL_TABLET | ORAL | Status: DC | PRN
  Administered 2021-08-24: 4 mg via ORAL

## 2021-08-24 MED ORDER — LIDOCAINE HCL (PF) 1 % IJ SOLN
INTRAMUSCULAR | Status: AC
  Filled 2021-08-24: qty 30

## 2021-08-24 MED ORDER — WITCH HAZEL-GLYCERIN EX PADS: 1.0000 "application " | MEDICATED_PAD | CUTANEOUS | Status: DC | PRN

## 2021-08-24 MED ORDER — ACETAMINOPHEN 325 MG PO TABS
650.0000 mg | ORAL_TABLET | ORAL | Status: DC | PRN
  Administered 2021-08-24: 650 mg via ORAL

## 2021-08-24 MED ORDER — METHYLERGONOVINE MALEATE 0.2 MG/ML IJ SOLN
INTRAMUSCULAR | Status: AC
  Filled 2021-08-24: qty 1

## 2021-08-24 MED ORDER — SOD CITRATE-CITRIC ACID 500-334 MG/5ML PO SOLN: 30.0000 mL | Freq: Once | ORAL | Status: DC

## 2021-08-24 MED ORDER — MISOPROSTOL 200 MCG PO TABS
ORAL_TABLET | ORAL | Status: AC
Start: 2021-08-24 — End: 2021-08-24
  Administered 2021-08-24: 800 ug
  Filled 2021-08-24: qty 4

## 2021-08-24 MED ORDER — PANTOPRAZOLE SODIUM 40 MG PO TBEC
40.0000 mg | DELAYED_RELEASE_TABLET | Freq: Every day | ORAL | Status: DC
  Administered 2021-08-24: 40 mg via ORAL
  Filled 2021-08-24: qty 1

## 2021-08-24 MED ORDER — ZOLPIDEM TARTRATE 5 MG PO TABS: 5.0000 mg | ORAL_TABLET | Freq: Every evening | ORAL | Status: DC | PRN

## 2021-08-24 MED ORDER — DIPHENHYDRAMINE HCL 25 MG PO CAPS: 25.0000 mg | ORAL_CAPSULE | Freq: Four times a day (QID) | ORAL | Status: DC | PRN

## 2021-08-24 MED ORDER — IBUPROFEN 600 MG PO TABS
600.0000 mg | ORAL_TABLET | Freq: Four times a day (QID) | ORAL | Status: DC
  Administered 2021-08-24 – 2021-08-25 (×3): 600 mg via ORAL
  Filled 2021-08-24 (×3): qty 1

## 2021-08-24 MED ORDER — PRENATAL MULTIVITAMIN CH
1.0000 | ORAL_TABLET | Freq: Every day | ORAL | Status: DC
  Administered 2021-08-25: 1 via ORAL
  Filled 2021-08-24: qty 1

## 2021-08-24 MED ORDER — COCONUT OIL OIL
1.0000 "application " | TOPICAL_OIL | Status: DC | PRN
  Administered 2021-08-25: 1 via TOPICAL
  Filled 2021-08-24: qty 120

## 2021-08-24 MED ORDER — DIBUCAINE (PERIANAL) 1 % EX OINT
1.0000 | TOPICAL_OINTMENT | CUTANEOUS | Status: DC | PRN
Start: 2021-08-24 — End: 2021-08-25

## 2021-08-24 MED ORDER — SENNOSIDES-DOCUSATE SODIUM 8.6-50 MG PO TABS
2.0000 | ORAL_TABLET | Freq: Every day | ORAL | Status: DC
  Filled 2021-08-24: qty 2

## 2021-08-24 MED ORDER — ACETAMINOPHEN 325 MG PO TABS
ORAL_TABLET | ORAL | Status: AC
  Filled 2021-08-24: qty 2

## 2021-08-24 MED ORDER — OXYTOCIN 10 UNIT/ML IJ SOLN
INTRAMUSCULAR | Status: AC
  Filled 2021-08-24: qty 2

## 2021-08-24 MED ORDER — ONDANSETRON HCL 4 MG/2ML IJ SOLN
4.0000 mg | INTRAMUSCULAR | Status: DC | PRN
Start: 2021-08-24 — End: 2021-08-25

## 2021-08-24 MED ORDER — SIMETHICONE 80 MG PO CHEW: 80.0000 mg | CHEWABLE_TABLET | ORAL | Status: DC | PRN

## 2021-08-24 MED ORDER — FERROUS SULFATE 325 (65 FE) MG PO TABS
325.0000 mg | ORAL_TABLET | Freq: Two times a day (BID) | ORAL | Status: DC
  Administered 2021-08-25: 325 mg via ORAL
  Filled 2021-08-24: qty 1

## 2021-08-24 MED ORDER — BENZOCAINE-MENTHOL 20-0.5 % EX AERO
1.0000 "application " | INHALATION_SPRAY | CUTANEOUS | Status: DC | PRN
  Administered 2021-08-25: 1 via TOPICAL
  Filled 2021-08-24: qty 56

## 2021-08-24 MED ORDER — AMMONIA AROMATIC IN INHA
RESPIRATORY_TRACT | Status: AC
  Filled 2021-08-24: qty 10

## 2021-08-24 MED ORDER — TETANUS-DIPHTH-ACELL PERTUSSIS 5-2.5-18.5 LF-MCG/0.5 IM SUSY
0.5000 mL | PREFILLED_SYRINGE | Freq: Once | INTRAMUSCULAR | Status: DC
  Filled 2021-08-24: qty 0.5

## 2021-08-24 NOTE — Progress Notes (Signed)
Labor Progress Note  Tracey Hebert is a 26 y.o. G2P0010 at [redacted]w[redacted]d by LMP admitted for active labor  Subjective: feeling more pressure, nausea and heartburn.   Objective: BP 119/66    Pulse 71    Temp 98.6 F (37 C) (Oral)    Resp 16    Ht 5\' 4"  (1.626 m)    Wt 87.5 kg    LMP 11/13/2020 Comment: o   SpO2 98%    BMI 33.13 kg/m  Notable VS details: reviewed.   Fetal Assessment: FHT:  FHR: 125 bpm, variability: moderate,  accelerations:  Present,  decelerations:  Absent Category/reactivity:  Category I UC:   regular, every 1.5-3 minutes; Pitocin at 63mu/min SVE:   9cm per last exam.  Membrane status: AROM, clear at 2321   Labs: Lab Results  Component Value Date   WBC 10.2 08/23/2021   HGB 9.7 (L) 08/23/2021   HCT 31.2 (L) 08/23/2021   MCV 76.8 (L) 08/23/2021   PLT 276 08/23/2021    Assessment / Plan: Protracted active phase  Labor:  slow but steady progression, now feeling more pressure. Plan to recheck 1 hour and begin pushing. Encouraged position changes to encourage rotation.  Preeclampsia:  no signs or symptoms of toxicity Fetal Wellbeing:  Category I Pain Control:  Epidural I/D:  n/a Anticipated MOD:  NSVD  Francetta Found, CNM 08/24/2021, 8:49 AM

## 2021-08-24 NOTE — Progress Notes (Signed)
Labor Progress Note  Tracey Hebert is a 26 y.o. G2P0010 at [redacted]w[redacted]d by LMP admitted for active labor  Subjective: resting in bed comfortable with epidural. Denies feeling any pressure or pain with contractions. FOB at bedside  Objective: BP (!) 99/56    Pulse 80    Temp 98.6 F (37 C) (Oral)    Resp 16    Ht 5\' 4"  (1.626 m)    Wt 87.5 kg    LMP 11/13/2020 Comment: o   SpO2 98%    BMI 33.13 kg/m  Notable VS details:   Fetal Assessment: FHT:  FHR: 125 bpm, variability: moderate,  accelerations:  Present,  decelerations:  Absent Category/reactivity:  Category I UC:   regular, every 2-4 minutes SVE:   9/90/-1 Membrane status: AROM @2321  Amniotic color: Clear  Labs: Lab Results  Component Value Date   WBC 10.2 08/23/2021   HGB 9.7 (L) 08/23/2021   HCT 31.2 (L) 08/23/2021   MCV 76.8 (L) 08/23/2021   PLT 276 08/23/2021    Assessment / Plan: Augmentation of labor, progressing well on 10 mil/u of Pitocin  Labor: Progressing normally,will change maternal positions and titrate Pitocin as necessary. Preeclampsia:  N/A Fetal Wellbeing:  Category I Pain Control:  Epidural I/D:   GBS neg, AROM x 8 hours Anticipated MOD:  NSVD  Temprence Rhines LUCY Coleman, CNM 08/24/2021, 7:26 AM

## 2021-08-24 NOTE — Discharge Summary (Signed)
Obstetrical Discharge Summary  Patient Name: Tracey Hebert DOB: [birth year] MRN: [id number]  Date of Admission: 08/23/2021 Date of Delivery: 08/24/21 Delivered by: Magda Kiel CNM Date of Discharge: 08/25/2021  Primary OB: [facility]  [id number] last menstrual period was 11/13/2020. EDC Estimated Date of Delivery: 08/20/21 Gestational Age at Delivery: 100w4d   Antepartum complications:  Hx Asthma GERD- taking prilosec daily Iron deficiency anemia  Admitting Diagnosis: Labor Secondary Diagnosis: SVD, 2nd deg  Patient Active Problem List   Diagnosis Date Noted   Indication for care in labor and delivery, antepartum 08/23/2021   Indication for care in labor or delivery 08/21/2021   Leakage of amniotic fluid 08/09/2021   Supervision of other normal pregnancy, antepartum 01/13/2021   Gastroesophageal reflux disease without esophagitis 12/22/2020   Rosacea 12/22/2020   Reactive airway disease 12/22/2020    Augmentation: AROM Complications: None Intrapartum complications/course: see delivery note Date of Delivery: 08/24/21 Delivered By: McVey CNM Delivery Type: spontaneous vaginal delivery Anesthesia: epidural Placenta: spontaneous Laceration: 2nd deg Episiotomy: none Newborn Data: Live born female "Wells Guiles" Birth Weight:  8lb 5.3oz APGAR: 51, 9  Newborn Delivery   Birth date/time: 08/24/2021 12:16:00 Delivery type: Vaginal, Spontaneous     Postpartum Procedures: none Edinburgh:  Edinburgh Postnatal Depression Scale Screening Tool 08/24/2021  I have been able to laugh and see the funny side of things. (No Data)     Post partum course:  Patient had an uncomplicated postpartum course.  By time of discharge on PPD#1, her pain was controlled on oral pain medications; she had appropriate lochia and was ambulating, voiding without difficulty and tolerating regular diet.  She was deemed stable for discharge to home.    Discharge Physical Exam:   BP (!) 96/57 (BP  Location: Right Arm)    Pulse (!) 56    Temp 98.2 F (36.8 C)    Resp 18    Ht 5\' 4"  (1.626 m)    Wt 87.5 kg    LMP 11/13/2020 Comment: o   SpO2 99%    BMI 33.13 kg/m   General: NAD CV: RRR Pulm: CTABL, nl effort ABD: s/nd/nt, fundus firm and below the umbilicus Lochia: moderate Perineum: well approximated/intact DVT Evaluation: LE non-ttp, no evidence of DVT on exam.  Hemoglobin  Date Value Ref Range Status  08/25/2021 7.9 (L) 12.0 - 15.0 g/dL Final    Comment:    Reticulocyte Hemoglobin testing may be clinically indicated, consider ordering this additional test [id number]   02/09/2021 12.5 11.1 - 15.9 g/dL Final   HCT  Date Value Ref Range Status  08/25/2021 25.1 (L) 36.0 - 46.0 % Final   Hematocrit  Date Value Ref Range Status  02/09/2021 38.4 34.0 - 46.6 % Final     Disposition: stable, discharge to home. Baby Feeding: breastmilk and formula Baby Disposition: home with mom  Rh Immune globulin given: n/a Rubella vaccine given: immune Varicella vaccine given: immune Tdap vaccine given in AP or PP setting: 06/07/21 Flu vaccine given in AP or PP setting: 04/05/21  Contraception: she is undecided, will get from [facility] @ 6 wks postpartum  Prenatal Labs:  Blood type/Rh AB Pos  Antibody screen neg  Rubella immune  Varicella immune  RPR NR  HBsAg Neg  HIV NR  GC neg  Chlamydia neg  Genetic screening declined  1 hour GTT 138  3 hour GTT 79, 133, 107, 92  GBS Negative     Plan:  Shayona Lavoie was discharged to home in good condition.  Follow-up appointment with delivering provider in 6 weeks.  Discharge Medications: Allergies as of 08/25/2021   No Known Allergies      Medication List     STOP taking these medications    omeprazole 20 MG capsule Commonly known as: PRILOSEC   ondansetron 4 MG disintegrating tablet Commonly known as: Zofran ODT       TAKE these medications    acetaminophen 325 MG tablet Commonly known as:  Tylenol Take 2 tablets (650 mg total) by mouth every 4 (four) hours as needed (for pain scale < 4).   albuterol 108 (90 Base) MCG/ACT inhaler Commonly known as: VENTOLIN HFA Inhale 1-2 puffs into the lungs every 4 (four) hours as needed for wheezing or shortness of breath.   benzocaine-Menthol 20-0.5 % Aero Commonly known as: DERMOPLAST Apply 1 application topically as needed for irritation (perineal discomfort).   ferrous sulfate 325 (65 FE) MG tablet Take 1 tablet (325 mg total) by mouth 2 (two) times daily with a meal.   ibuprofen 600 MG tablet Commonly known as: ADVIL Take 1 tablet (600 mg total) by mouth every 6 (six) hours.   multivitamin-prenatal 27-0.8 MG Tabs tablet Take 1 tablet by mouth daily at 12 noon.   witch hazel-glycerin pad Commonly known as: TUCKS Apply 1 application topically as needed for hemorrhoids.         Follow-up [place], [place] Follow up in 6 week(s).   Specialty: General Practice Why: 6wk postpartum undecided for contraception Contact information: [address] [place] [address] [phone]                 Signed:  Gertie Fey, CNM 08/25/2021 9:21 AM

## 2021-08-25 LAB — CBC
HCT: 25.1 % — ABNORMAL LOW (ref 36.0–46.0)
Hemoglobin: 7.9 g/dL — ABNORMAL LOW (ref 12.0–15.0)
MCH: 24.1 pg — ABNORMAL LOW (ref 26.0–34.0)
MCHC: 31.5 g/dL (ref 30.0–36.0)
MCV: 76.5 fL — ABNORMAL LOW (ref 80.0–100.0)
Platelets: 199 10*3/uL (ref 150–400)
RBC: 3.28 MIL/uL — ABNORMAL LOW (ref 3.87–5.11)
RDW: 15 % (ref 11.5–15.5)
WBC: 17.9 10*3/uL — ABNORMAL HIGH (ref 4.0–10.5)
nRBC: 0 % (ref 0.0–0.2)

## 2021-08-25 MED ORDER — IBUPROFEN 600 MG PO TABS: 600.0000 mg | ORAL_TABLET | Freq: Four times a day (QID) | ORAL | 0 refills | Status: DC

## 2021-08-25 MED ORDER — ACETAMINOPHEN 325 MG PO TABS: 650.0000 mg | ORAL_TABLET | ORAL | Status: DC | PRN

## 2021-08-25 MED ORDER — BENZOCAINE-MENTHOL 20-0.5 % EX AERO: 1.0000 "application " | INHALATION_SPRAY | CUTANEOUS | Status: DC | PRN

## 2021-08-25 MED ORDER — WITCH HAZEL-GLYCERIN EX PADS: 1.0000 "application " | MEDICATED_PAD | CUTANEOUS | 12 refills | Status: DC | PRN

## 2021-08-25 MED ORDER — FERROUS SULFATE 325 (65 FE) MG PO TABS: 325.0000 mg | ORAL_TABLET | Freq: Two times a day (BID) | ORAL | 3 refills | Status: DC

## 2021-08-25 NOTE — Lactation Note (Signed)
This note was copied from a baby's chart. Lactation Consultation Note  Patient Name: Tracey Hebert [id number] Date: 08/25/2021 Reason for consult: Initial assessment Age:28 hours  Maternal Data Has patient been taught Hand Expression?: Yes Does the patient have breastfeeding experience prior to this delivery?: No  Feeding Mother's Current Feeding Choice: Breast Milk and Formula Nipple Type: Slow - flow Of note mother has been providing ~ 15 ml formula though prefers to use maternal milk if able.  Has been pumping.    LATCH Score   Feeding not observed - encouraged to call  Lactation Tools Discussed/Used  Mother currently putting Tracey Hebert to breast though had some soreness and was given a shield that has decided does not like to use.  She prefers HE or manual/electric pre feed pumping to help evert nipple.  Of note, also using coconut oil for comfort.  Mother has manual pump for home use and has requested electric pump.  Went over options and chose Medela freestyle flex.  Paperwork filled out and insurance card copy made for medical records file.  Reviewed pumping frequency especially if baby not latching and milk storage recommendations.  Also reviewed paced bottle feeding (has been pumping and feeding via bottle - or spoon feeding if not enough for bottle ~ 10 ml pumped) Parents plan to breatfeed with pumped milk if needed and formula if needed.    Interventions Interventions: Breast feeding basics reviewed;Hand express;Pre-pump if needed;Expressed milk;Coconut oil;Hand pump;DEBP;Pace feeding;Education  Discharge Discharge Education: Engorgement and breast care;Warning signs for feeding baby;Outpatient recommendation (recommend outpatient support especially over next few days and as transitions back to work) Pump: Employee Pump;DEBP (freestyle flex preferred and given - instructed on storage) WIC Program: No Reviewed feeding frequency and diaper counts for days of life and when to  call Peds with questions. Reviewed "understanding Postpartum and Newborn care " booklet at bedside. Reviewed outpatient Lactation number and resources. Reviewed pacifier (and bottles unless needed/desired) are not encouraged until breastfeeding is established and going well in the first 4 weeks. Parents stated understanding with all teaching.    Consult Status Consult Status: Follow-up (recommended call for next feed though discharging today) Date: 08/25/21 (phone number on board to call)    Tracey Hebert Tracey Hebert 08/25/2021, 12:39 PM

## 2021-08-25 NOTE — Progress Notes (Signed)
Pt discharged with infant.  Discharge instructions, prescriptions and follow up appointment given to and reviewed with pt. Pt verbalized understanding. Escorted out by auxillary. 

## 2021-08-25 NOTE — Anesthesia Postprocedure Evaluation (Signed)
Anesthesia Post Note  Patient: Tracey Hebert  Procedure(s) Performed: AN AD [place]  Patient location during evaluation: Mother Baby Anesthesia Type: Epidural Level of consciousness: awake and alert Pain management: pain level controlled Vital Signs Assessment: post-procedure vital signs reviewed and stable Respiratory status: spontaneous breathing, nonlabored ventilation and respiratory function stable Cardiovascular status: stable Postop Assessment: no headache, no backache and epidural receding Anesthetic complications: no   No notable events documented.   Last Vitals:  Vitals:   08/25/21 0346 08/25/21 0738  BP: (!) 106/51 (!) 96/57  Pulse: (!) 53 (!) 56  Resp:  18  Temp: 37.4 C 36.8 C  SpO2: 96% 99%    Last Pain:  Vitals:   08/25/21 0346  TempSrc: Oral  PainSc:                  Blima Singer

## 2021-11-05 ENCOUNTER — Encounter: Payer: Self-pay | Admitting: Obstetrics and Gynecology

## 2021-11-06 IMAGING — US US OB COMP +14 WK
1 series · 15 of 28 positions shown · non-contrast
Comparison: none

CLINICAL DATA: Fetal anatomic survey

EXAM:
OBSTETRICAL ULTRASOUND >14 WKS

[Series 1: us ob comp + 14 wk · 15 of 95 slices shown]
[im 1/95]
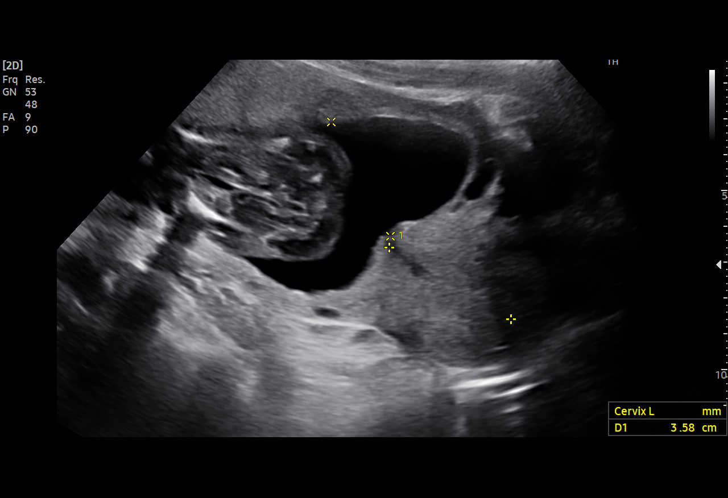
[im 7/95]
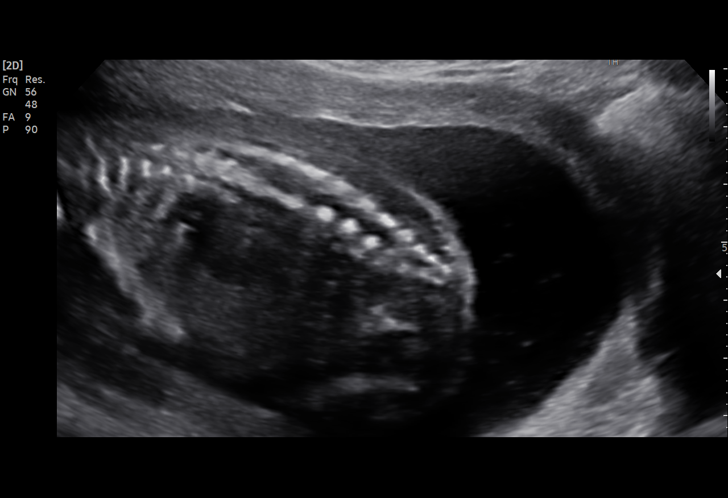
[im 14/95]
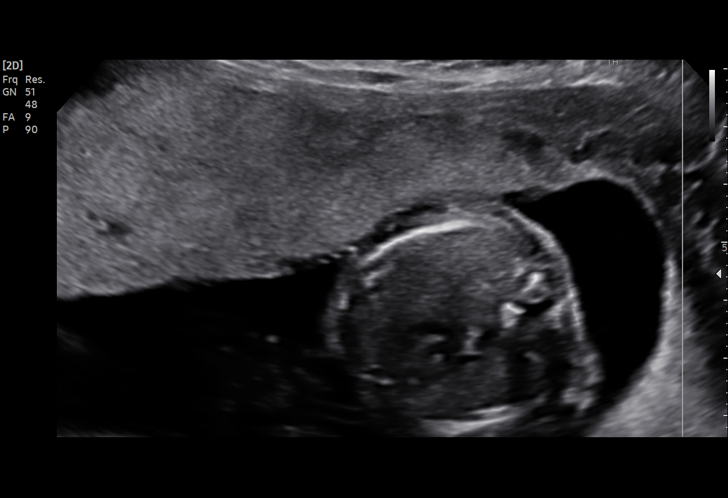
[im 21/95]
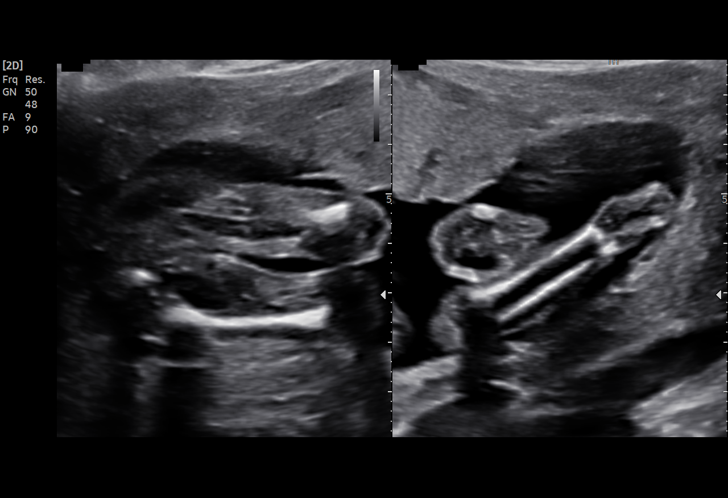
[im 28/95]
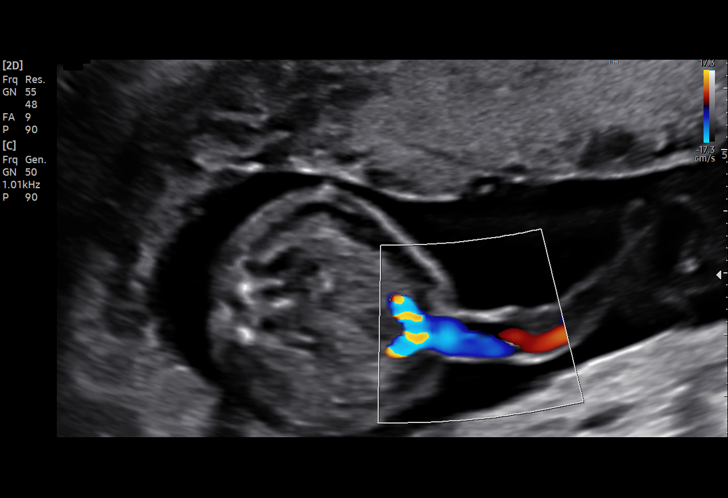
[im 35/95]
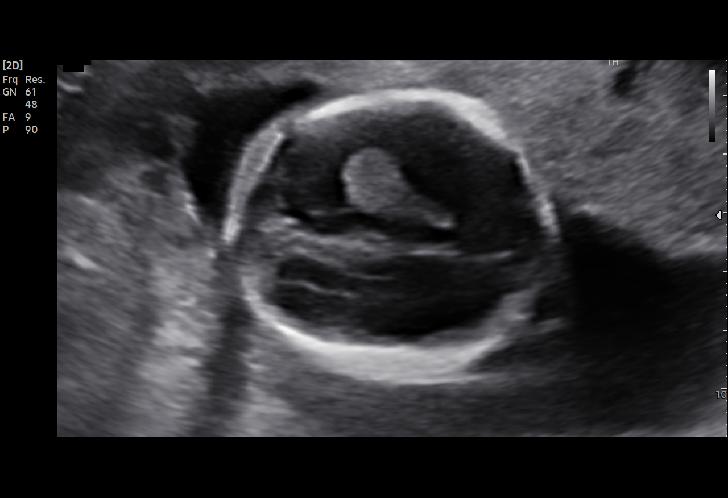
[im 42/95]
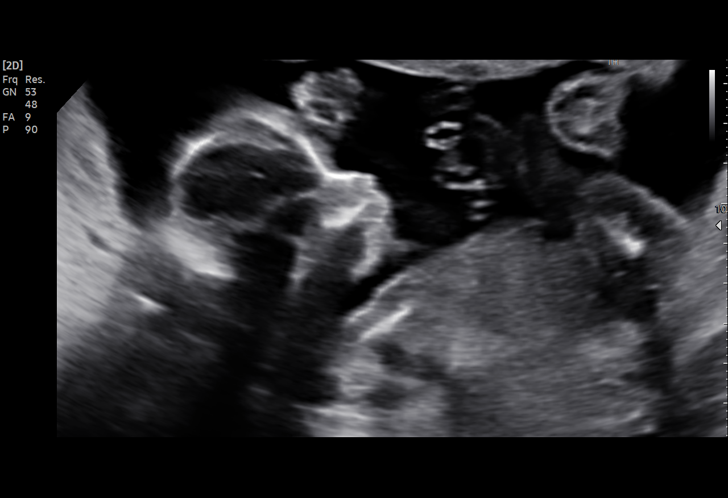
[im 49/95]
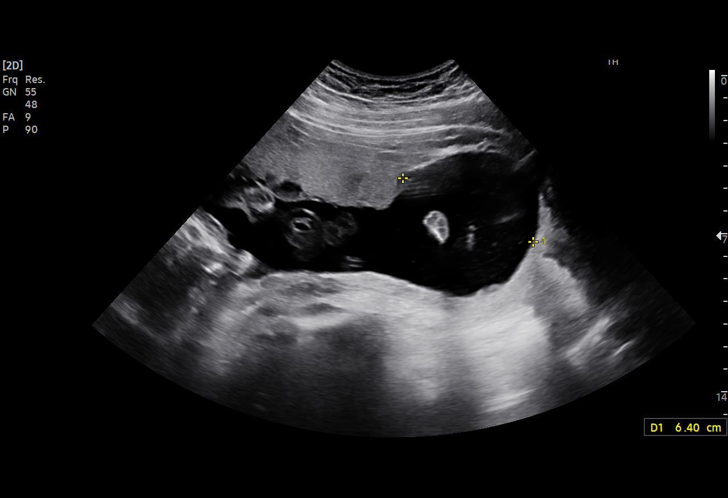
[im 53/95]
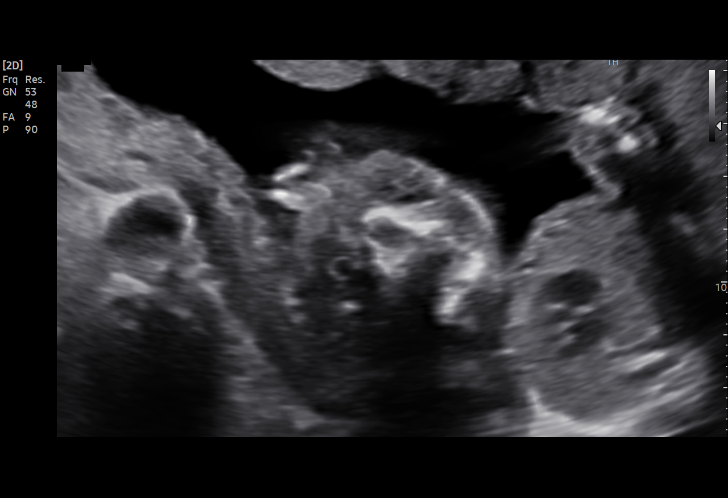
[im 60/95]
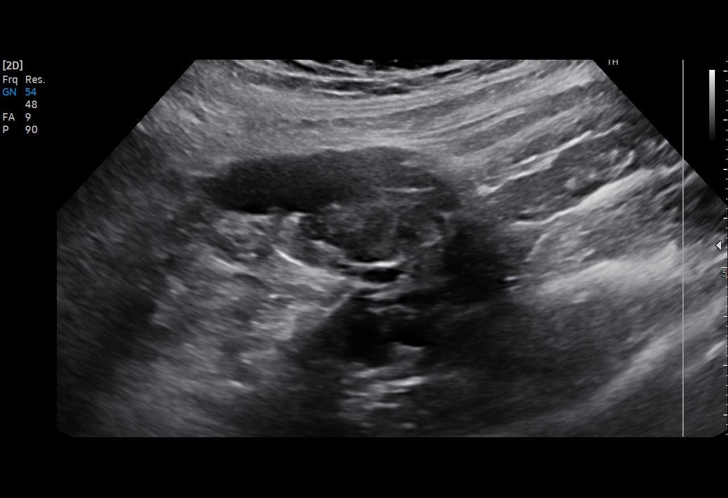
[im 67/95]
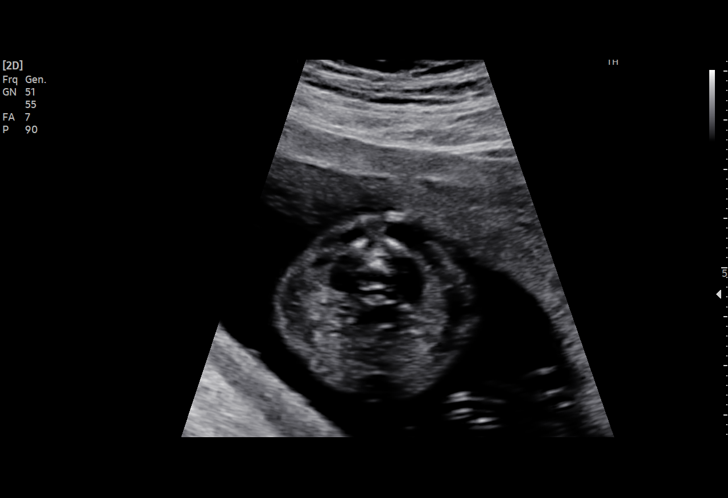
[im 74/95]
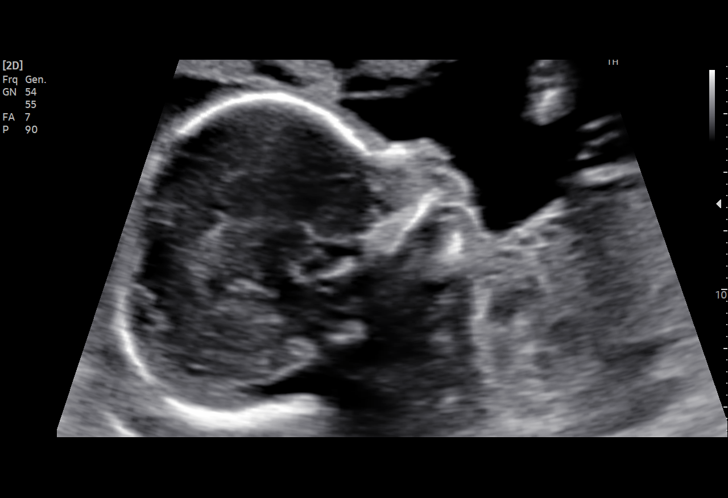
[im 81/95]
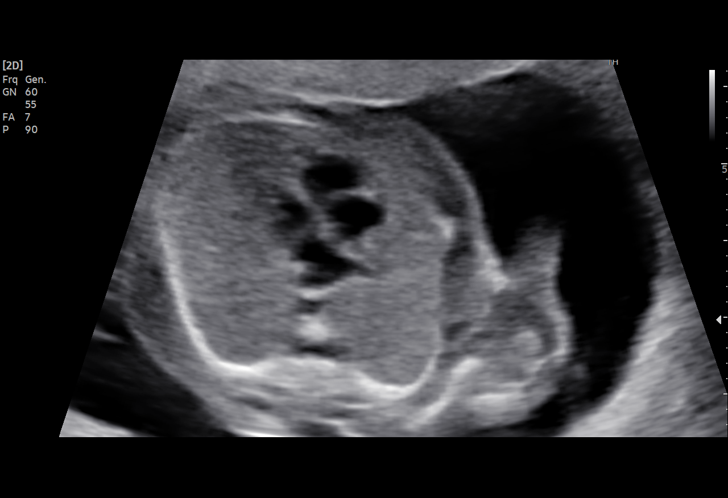
[im 88/95]
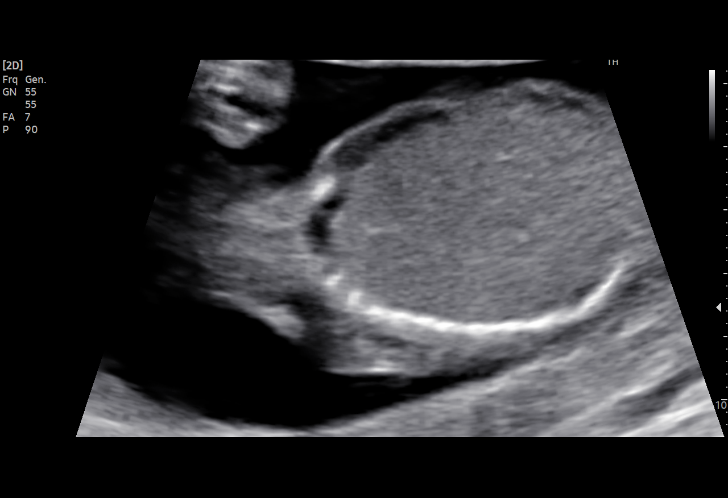
[im 95/95]
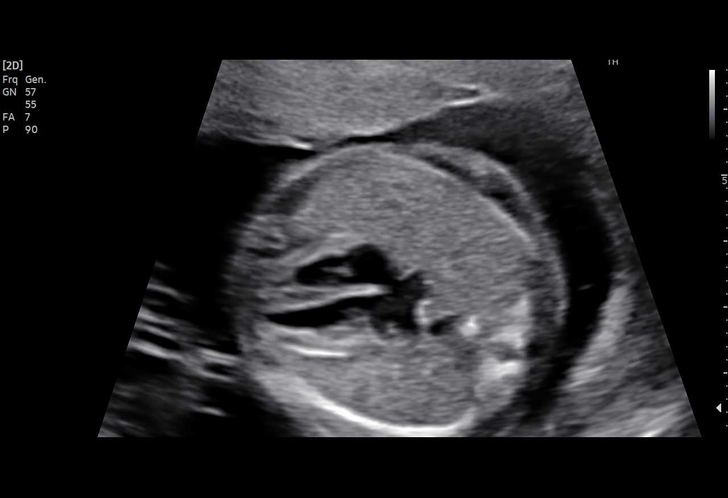

[15 of 28 positions shown; findings below may reference images not displayed]

FINDINGS: Number of Fetuses: 1

Heart Rate:  145 bpm

Movement: Yes

Presentation: Transverse

Previa: No

Placental Location: Anterior

Amniotic Fluid (Subjective): Normal

Amniotic Fluid (Objective):

Vertical pocket = 6.4cm

FETAL BIOMETRY

BPD: 4.8cm 20w 4d

HC:   17.6cm 20w 1d

AC:   16.1cm 21w 1d

FL:   3.2cm 20w 1d

Current Mean GA: 20w 2d US EDC: 08/16/2021

Assigned GA:  19w 5d Assigned EDC: 08/20/2021

FETAL ANATOMY

Lateral Ventricles: Appears normal

Thalami/CSP: Appears normal

Posterior Fossa:  Appears normal

Nuchal Region: Appears normal   NFT=

Upper Lip: Appears normal

Spine: Appears normal

4 Chamber Heart on Left: Appears normal

LVOT: Appears normal

RVOT: Appears normal

Stomach on Left: Appears normal

3 Vessel Cord: Appears normal

Cord Insertion site: Appears normal

Kidneys: Appears normal

Bladder: Appears normal

Extremities: Appears normal

Technically difficult due to: N/a

Maternal Findings:

Cervix:  3.9 cm
IMPRESSION: 1. Single live intrauterine pregnancy as above, estimated age 20
weeks and 2 days.
2. Normal fetal anatomic survey.

## 2022-01-31 ENCOUNTER — Telehealth: Payer: No Typology Code available for payment source | Admitting: Family Medicine

## 2022-01-31 DIAGNOSIS — J019 Acute sinusitis, unspecified: Secondary | ICD-10-CM | POA: Diagnosis not present

## 2022-01-31 DIAGNOSIS — B9689 Other specified bacterial agents as the cause of diseases classified elsewhere: Secondary | ICD-10-CM

## 2022-01-31 MED ORDER — CEFDINIR 300 MG PO CAPS: 300.0000 mg | ORAL_CAPSULE | Freq: Two times a day (BID) | ORAL | 0 refills | Status: AC

## 2022-01-31 NOTE — Progress Notes (Signed)
Virtual Visit Consent   Tracey Hebert, you are scheduled for a virtual visit with a [facility] provider today. Just as with appointments in the office, your consent must be obtained to participate. Your consent will be active for this visit and any virtual visit you may have with one of our providers in the next 365 days. If you have a MyChart account, a copy of this consent can be sent to you electronically.  As this is a virtual visit, video technology does not allow for your provider to perform a traditional examination. This may limit your provider's ability to fully assess your condition. If your provider identifies any concerns that need to be evaluated in person or the need to arrange testing (such as labs, EKG, etc.), we will make arrangements to do so. Although advances in technology are sophisticated, we cannot ensure that it will always work on either your end or our end. If the connection with a video visit is poor, the visit may have to be switched to a telephone visit. With either a video or telephone visit, we are not always able to ensure that we have a secure connection.  By engaging in this virtual visit, you consent to the provision of healthcare and authorize for your insurance to be billed (if applicable) for the services provided during this visit. Depending on your insurance coverage, you may receive a charge related to this service.  I need to obtain your verbal consent now. Are you willing to proceed with your visit today? Tracey Hebert has provided verbal consent on 01/31/2022 for a virtual visit (video or telephone). Georgana Curio, FNP  Date: 01/31/2022 8:32 AM  Virtual Visit via Video Note   I, Georgana Curio, connected with  Tracey Hebert  ([id number], [birth year]) on 01/31/22 at  8:30 AM EDT by a video-enabled telemedicine application and verified that I am speaking with the correct person using two identifiers.  Location: Patient: Virtual  Visit Location Patient: Home Provider: Virtual Visit Location Provider: Home Office   I discussed the limitations of evaluation and management by telemedicine and the availability of in person appointments. The patient expressed understanding and agreed to proceed.    History of Present Illness: Tracey Hebert is a 26 y.o. who identifies as a female who was assigned female at birth, and is being seen today for sinus pain and pressure for 4-5 days. Mucus is thick green. Cough at night. No fever. Neg at home covid testing. No fever. No wheezing or SOB. Marland Kitchen  HPI: HPI  Problems:  Patient Active Problem List   Diagnosis Date Noted   Indication for care in labor and delivery, antepartum 08/23/2021   Indication for care in labor or delivery 08/21/2021   Leakage of amniotic fluid 08/09/2021   Supervision of other normal pregnancy, antepartum 01/13/2021   Gastroesophageal reflux disease without esophagitis 12/22/2020   Rosacea 12/22/2020   Reactive airway disease 12/22/2020    Allergies: No Known Allergies Medications:  Current Outpatient Medications:    acetaminophen (TYLENOL) 325 MG tablet, Take 2 tablets (650 mg total) by mouth every 4 (four) hours as needed (for pain scale < 4)., Disp: , Rfl:    albuterol (VENTOLIN HFA) 108 (90 Base) MCG/ACT inhaler, Inhale 1-2 puffs into the lungs every 4 (four) hours as needed for wheezing or shortness of breath., Disp: 1 each, Rfl: 0   benzocaine-Menthol (DERMOPLAST) 20-0.5 % AERO, Apply 1 application topically as needed for irritation (perineal discomfort)., Disp: , Rfl:  ferrous sulfate 325 (65 FE) MG tablet, Take 1 tablet (325 mg total) by mouth 2 (two) times daily with a meal., Disp: , Rfl: 3   ibuprofen (ADVIL) 600 MG tablet, Take 1 tablet (600 mg total) by mouth every 6 (six) hours., Disp: 30 tablet, Rfl: 0   Prenatal Vit-Fe Fumarate-FA (MULTIVITAMIN-PRENATAL) 27-0.8 MG TABS tablet, Take 1 tablet by mouth daily at 12 noon., Disp: , Rfl:     witch hazel-glycerin (TUCKS) pad, Apply 1 application topically as needed for hemorrhoids., Disp: 40 each, Rfl: 12  Observations/Objective: Patient is well-developed, well-nourished in no acute distress.  Resting comfortably  at home.  Head is normocephalic, atraumatic. Hoarse. No labored breathing.  Speech is clear and coherent with logical content.  Patient is alert and oriented at baseline.    Assessment and Plan: 1. Acute bacterial sinusitis  Increase fluids, med use and side effects discussed, delsym at night for cough, proceed to urgent care if sx persist or worsen.   Follow Up Instructions: I discussed the assessment and treatment plan with the patient. The patient was provided an opportunity to ask questions and all were answered. The patient agreed with the plan and demonstrated an understanding of the instructions.  A copy of instructions were sent to the patient via MyChart unless otherwise noted below.     The patient was advised to call back or seek an in-person evaluation if the symptoms worsen or if the condition fails to improve as anticipated.  Time:  I spent 8 minutes with the patient via telehealth technology discussing the above problems/concerns.    Georgana Curio, FNP

## 2022-01-31 NOTE — Patient Instructions (Signed)

## 2022-02-06 ENCOUNTER — Encounter: Payer: Self-pay | Admitting: Internal Medicine

## 2022-02-06 ENCOUNTER — Ambulatory Visit (INDEPENDENT_AMBULATORY_CARE_PROVIDER_SITE_OTHER): Payer: No Typology Code available for payment source | Admitting: Internal Medicine

## 2022-02-06 VITALS — BP 128/80 | HR 74 | Temp 97.3°F | Wt 164.0 lb

## 2022-02-06 DIAGNOSIS — Z0001 Encounter for general adult medical examination with abnormal findings: Secondary | ICD-10-CM | POA: Diagnosis not present

## 2022-02-06 DIAGNOSIS — T3695XA Adverse effect of unspecified systemic antibiotic, initial encounter: Secondary | ICD-10-CM

## 2022-02-06 DIAGNOSIS — Z30011 Encounter for initial prescription of contraceptive pills: Secondary | ICD-10-CM

## 2022-02-06 DIAGNOSIS — Z1159 Encounter for screening for other viral diseases: Secondary | ICD-10-CM | POA: Diagnosis not present

## 2022-02-06 DIAGNOSIS — E663 Overweight: Secondary | ICD-10-CM

## 2022-02-06 DIAGNOSIS — F411 Generalized anxiety disorder: Secondary | ICD-10-CM

## 2022-02-06 DIAGNOSIS — B379 Candidiasis, unspecified: Secondary | ICD-10-CM

## 2022-02-06 DIAGNOSIS — Z6828 Body mass index (BMI) 28.0-28.9, adult: Secondary | ICD-10-CM

## 2022-02-06 MED ORDER — ALBUTEROL SULFATE HFA 108 (90 BASE) MCG/ACT IN AERS
1.0000 | INHALATION_SPRAY | RESPIRATORY_TRACT | 2 refills | Status: DC | PRN
Start: 2022-02-06 — End: 2022-12-18

## 2022-02-06 MED ORDER — NORETHINDRONE ACET-ETHINYL EST 1-20 MG-MCG PO TABS: 1.0000 | ORAL_TABLET | Freq: Every day | ORAL | 3 refills | Status: DC

## 2022-02-06 MED ORDER — FLUCONAZOLE 150 MG PO TABS: 150.0000 mg | ORAL_TABLET | Freq: Once | ORAL | 0 refills | Status: AC

## 2022-02-06 MED ORDER — BUSPIRONE HCL 5 MG PO TABS
5.0000 mg | ORAL_TABLET | Freq: Three times a day (TID) | ORAL | 2 refills | Status: DC | PRN
Start: 2022-02-06 — End: 2023-02-26

## 2022-02-06 NOTE — Assessment & Plan Note (Signed)
Deteriorated Will try buspirone 5 mg 3 times daily as needed Support offered

## 2022-02-06 NOTE — Patient Instructions (Signed)

## 2022-02-06 NOTE — Progress Notes (Signed)
Subjective:    Patient ID: Tracey Hebert, female    DOB: 1995/12/06, 26 y.o.   MRN: [id number]  HPI  Patient presents to clinic today for her annual exam.  She has been having some anxiety.  She feels anxious more days than not.  She denies depression.  She is not currently taking any medication for this but would like to take something as needed.  She is not seeing a therapist.  She denies SI/HI.  She is also on antibiotics and would like to know if she could get a Diflucan.  She reports she frequently gets yeast infections while on antibiotics.  Flu: 04/2021 Tetanus: 05/2021 COVID: Pfizer x 3  Pap smear: 12/2020 Dentist: biannually  Diet: She does eat some meat. She consumes fruits and veggies. She does eat some fried foods.  She drinks mostly water. Exercise: None  Review of Systems     Past Medical History:  Diagnosis Date   Anxiety    Depression    GERD (gastroesophageal reflux disease)     Current Outpatient Medications  Medication Sig Dispense Refill   acetaminophen (TYLENOL) 325 MG tablet Take 2 tablets (650 mg total) by mouth every 4 (four) hours as needed (for pain scale < 4).     albuterol (VENTOLIN HFA) 108 (90 Base) MCG/ACT inhaler Inhale 1-2 puffs into the lungs every 4 (four) hours as needed for wheezing or shortness of breath. 1 each 0   benzocaine-Menthol (DERMOPLAST) 20-0.5 % AERO Apply 1 application topically as needed for irritation (perineal discomfort).     cefdinir (OMNICEF) 300 MG capsule Take 1 capsule (300 mg total) by mouth 2 (two) times daily for 10 days. 20 capsule 0   ferrous sulfate 325 (65 FE) MG tablet Take 1 tablet (325 mg total) by mouth 2 (two) times daily with a meal.  3   ibuprofen (ADVIL) 600 MG tablet Take 1 tablet (600 mg total) by mouth every 6 (six) hours. 30 tablet 0   Prenatal Vit-Fe Fumarate-FA (MULTIVITAMIN-PRENATAL) 27-0.8 MG TABS tablet Take 1 tablet by mouth daily at 12 noon.     witch hazel-glycerin (TUCKS) pad  Apply 1 application topically as needed for hemorrhoids. 40 each 12   No current facility-administered medications for this visit.    No Known Allergies  Family History  Problem Relation Age of Onset   Depression Mother    Hypertension Father    Depression Father     Social History   Socioeconomic History   Marital status: Single    Spouse name: Rigoberto   Number of children: Not on file   Years of education: Not on file   Highest education level: Not on file  Occupational History   Not on file  Tobacco Use   Smoking status: Never   Smokeless tobacco: Never  Vaping Use   Vaping Use: Never used  Substance and Sexual Activity   Alcohol use: Not Currently   Drug use: Never   Sexual activity: Yes    Birth control/protection: None    Comment: undecided  Other Topics Concern   Not on file  Social History Narrative   Not on file   Social Determinants of Health   Financial Resource Strain: Not on file  Food Insecurity: Not on file  Transportation Needs: Not on file  Physical Activity: Not on file  Stress: Not on file  Social Connections: Not on file  Intimate Partner Violence: Not on file     Constitutional: Denies fever,  malaise, fatigue, headache or abrupt weight changes.  HEENT: Denies eye pain, eye redness, ear pain, ringing in the ears, wax buildup, runny nose, nasal congestion, bloody nose, or sore throat. Respiratory: Denies difficulty breathing, shortness of breath, cough or sputum production.   Cardiovascular: Denies chest pain, chest tightness, palpitations or swelling in the hands or feet.  Gastrointestinal: Pt reports intermittent reflux. Denies abdominal pain, bloating, constipation, diarrhea or blood in the stool.  GU: Denies urgency, frequency, pain with urination, burning sensation, blood in urine, odor or discharge. Musculoskeletal: Denies decrease in range of motion, difficulty with gait, muscle pain or joint pain and swelling.  Skin: Denies  redness, rashes, lesions or ulcercations.  Neurological: Denies dizziness, difficulty with memory, difficulty with speech or problems with balance and coordination.  Psych: Patient reports anxiety.  Denies depression, SI/HI.  No other specific complaints in a complete review of systems (except as listed in HPI above).  Objective:   Physical Exam  BP 128/80 (BP Location: Left Arm, Patient Position: Sitting, Cuff Size: Normal)   Pulse 74   Temp (!) 97.3 F (36.3 C) (Temporal)   Wt 164 lb (74.4 kg)   SpO2 99%   BMI 28.15 kg/m   Wt Readings from Last 3 Encounters:  08/23/21 193 lb (87.5 kg)  08/09/21 194 lb (88 kg)  07/29/21 191 lb (86.6 kg)    General: Appears her stated age, overweight in NAD. Skin: Warm, dry and intact.  HEENT: Head: normal shape and size; Eyes: sclera white, no icterus, conjunctiva pink, PERRLA and EOMs intact;  Neck:  Neck supple, trachea midline. No masses, lumps or thyromegaly present.  Cardiovascular: Normal rate and rhythm. S1,S2 noted.  No murmur, rubs or gallops noted. No JVD or BLE edema.  Pulmonary/Chest: Normal effort and positive vesicular breath sounds. No respiratory distress. No wheezes, rales or ronchi noted.  Abdomen: Normal bowel sounds. Musculoskeletal: Strength 5/5 BUE/BLE.  No difficulty with gait.  Neurological: Alert and oriented. Cranial nerves II-XII grossly intact. Coordination normal.  Psychiatric: Mood and affect normal. Behavior is normal. Judgment and thought content normal.    BMET    Component Value Date/Time   NA 138 01/12/2020 2326   K 3.8 01/12/2020 2326   CL 106 01/12/2020 2326   CO2 25 01/12/2020 2326   GLUCOSE 113 (H) 01/12/2020 2326   BUN 17 01/12/2020 2326   CREATININE 0.80 01/12/2020 2326   CALCIUM 8.9 01/12/2020 2326   GFRNONAA >60 01/12/2020 2326   GFRAA >60 01/12/2020 2326    Lipid Panel  No results found for: "CHOL", "TRIG", "HDL", "CHOLHDL", "VLDL", "LDLCALC"  CBC    Component Value Date/Time   WBC  17.9 (H) 08/25/2021 0539   RBC 3.28 (L) 08/25/2021 0539   HGB 7.9 (L) 08/25/2021 0539   HGB 12.5 02/09/2021 1619   HCT 25.1 (L) 08/25/2021 0539   HCT 38.4 02/09/2021 1619   PLT 199 08/25/2021 0539   PLT 355 02/09/2021 1619   MCV 76.5 (L) 08/25/2021 0539   MCV 85 02/09/2021 1619   MCH 24.1 (L) 08/25/2021 0539   MCHC 31.5 08/25/2021 0539   RDW 15.0 08/25/2021 0539   RDW 13.4 02/09/2021 1619    Hgb A1C No results found for: "HGBA1C"         Assessment & Plan:   Preventative Health Maintenance:  Encouraged her to get a flu shot in the fall Tetanus UTD Encouraged her to get a COVID-vaccine Pap smear UTD Encouraged her to consume a balanced diet and  exercise regimen Advised her to see a dentist annually Will check CBC, c-Met, lipid, A1c and hep C today  Antibiotic Induced Yeast Infection:  Rx for Fluconazole 150 mg p.o. x 1  Encounter for Initiation for Oral Birth Control:  Rx for Junel 1-20 mcg, start this Sunday after your next period  RTC in 6 months, follow-up chronic conditions Webb Silversmith, NP

## 2022-02-06 NOTE — Assessment & Plan Note (Signed)
Encourage diet and exercise for weight loss 

## 2022-02-07 LAB — LIPID PANEL
Cholesterol: 189 mg/dL (ref <200)
HDL: 77 mg/dL (ref >=50)
LDL Cholesterol (Calc): 93 mg/dL (calc)
Non-HDL Cholesterol (Calc): 112 mg/dL (calc) (ref <130)
Total CHOL/HDL Ratio: 2.5 (calc) (ref <5.0)
Triglycerides: 93 mg/dL (ref <150)

## 2022-02-07 LAB — COMPLETE METABOLIC PANEL WITH GFR
AG Ratio: 1.5 (calc) (ref 1.0–2.5)
ALT: 15 U/L (ref 6–29)
AST: 14 U/L (ref 10–30)
Albumin: 4.3 g/dL (ref 3.6–5.1)
Alkaline phosphatase (APISO): 88 U/L (ref 31–125)
BUN: 12 mg/dL (ref 7–25)
CO2: 26 mmol/L (ref 20–32)
Calcium: 9.4 mg/dL (ref 8.6–10.2)
Chloride: 106 mmol/L (ref 98–110)
Creat: 0.8 mg/dL (ref 0.50–0.96)
Globulin: 2.9 g/dL (calc) (ref 1.9–3.7)
Glucose, Bld: 106 mg/dL (ref 65–139)
Potassium: 3.9 mmol/L (ref 3.5–5.3)
Sodium: 140 mmol/L (ref 135–146)
Total Bilirubin: 0.5 mg/dL (ref 0.2–1.2)
Total Protein: 7.2 g/dL (ref 6.1–8.1)
eGFR: 103 mL/min/{1.73_m2} (ref >=60)

## 2022-02-07 LAB — HEPATITIS C ANTIBODY: Hepatitis C Ab: NONREACTIVE

## 2022-02-07 LAB — CBC
HCT: 40.7 % (ref 35.0–45.0)
Hemoglobin: 13 g/dL (ref 11.7–15.5)
MCH: 26.3 pg — ABNORMAL LOW (ref 27.0–33.0)
MCHC: 31.9 g/dL — ABNORMAL LOW (ref 32.0–36.0)
MCV: 82.4 fL (ref 80.0–100.0)
MPV: 8.9 fL (ref 7.5–12.5)
Platelets: 365 10*3/uL (ref 140–400)
RBC: 4.94 10*6/uL (ref 3.80–5.10)
RDW: 15.1 % — ABNORMAL HIGH (ref 11.0–15.0)
WBC: 6.6 10*3/uL (ref 3.8–10.8)

## 2022-02-07 LAB — HEMOGLOBIN A1C
Hgb A1c MFr Bld: 5.4 % of total Hgb (ref <5.7)
Mean Plasma Glucose: 108 mg/dL
eAG (mmol/L): 6 mmol/L

## 2022-02-16 ENCOUNTER — Encounter: Payer: Self-pay | Admitting: Dermatology

## 2022-02-16 DIAGNOSIS — L719 Rosacea, unspecified: Secondary | ICD-10-CM

## 2022-02-19 ENCOUNTER — Other Ambulatory Visit: Payer: Self-pay

## 2022-02-19 MED ORDER — RHOFADE 1 % EX CREA
TOPICAL_CREAM | CUTANEOUS | 5 refills | Status: DC
  Filled 2022-02-19: qty 30, 30d supply, fill #0

## 2022-02-20 ENCOUNTER — Other Ambulatory Visit: Payer: Self-pay

## 2022-02-23 ENCOUNTER — Other Ambulatory Visit: Payer: Self-pay

## 2022-02-26 ENCOUNTER — Other Ambulatory Visit: Payer: Self-pay

## 2022-02-26 MED ORDER — RHOFADE 1 % EX CREA: TOPICAL_CREAM | CUTANEOUS | 5 refills | Status: DC

## 2022-02-26 NOTE — Addendum Note (Signed)
Addended by: Cari Caraway A on: 02/26/2022 04:58 PM   Modules accepted: Orders

## 2022-04-04 ENCOUNTER — Other Ambulatory Visit: Payer: Self-pay

## 2022-04-04 ENCOUNTER — Encounter: Payer: Self-pay | Admitting: Internal Medicine

## 2022-04-04 MED ORDER — NORETHINDRONE ACET-ETHINYL EST 1-20 MG-MCG PO TABS
1.0000 | ORAL_TABLET | Freq: Every day | ORAL | 3 refills | Status: DC
  Filled 2022-04-04: qty 84, 84d supply, fill #0
  Filled 2022-06-07: qty 84, 84d supply, fill #1
  Filled 2022-09-18: qty 84, 84d supply, fill #2
  Filled 2022-12-14: qty 84, 84d supply, fill #3

## 2022-06-07 ENCOUNTER — Other Ambulatory Visit: Payer: Self-pay

## 2022-09-18 ENCOUNTER — Other Ambulatory Visit: Payer: Self-pay

## 2022-12-14 ENCOUNTER — Other Ambulatory Visit: Payer: Self-pay

## 2022-12-14 MED ORDER — METHYLPREDNISOLONE 4 MG PO TBPK
ORAL_TABLET | ORAL | 0 refills | Status: DC
  Filled 2022-12-14: qty 21, 6d supply, fill #0

## 2022-12-18 ENCOUNTER — Telehealth: Payer: 59 | Admitting: Family Medicine

## 2022-12-18 ENCOUNTER — Other Ambulatory Visit: Payer: Self-pay

## 2022-12-18 DIAGNOSIS — J4521 Mild intermittent asthma with (acute) exacerbation: Secondary | ICD-10-CM | POA: Diagnosis not present

## 2022-12-18 DIAGNOSIS — J208 Acute bronchitis due to other specified organisms: Secondary | ICD-10-CM | POA: Diagnosis not present

## 2022-12-18 MED ORDER — PSEUDOEPH-BROMPHEN-DM 30-2-10 MG/5ML PO SYRP
5.0000 mL | ORAL_SOLUTION | Freq: Four times a day (QID) | ORAL | 0 refills | Status: DC | PRN
  Filled 2022-12-18: qty 120, 6d supply, fill #0

## 2022-12-18 MED ORDER — ALBUTEROL SULFATE HFA 108 (90 BASE) MCG/ACT IN AERS
1.0000 | INHALATION_SPRAY | RESPIRATORY_TRACT | 2 refills | Status: DC | PRN
  Filled 2022-12-18: qty 6.7, 16d supply, fill #0
  Filled 2023-06-04: qty 6.7, 16d supply, fill #1

## 2022-12-18 NOTE — Progress Notes (Signed)
Virtual Visit Consent   Tracey Hebert, you are scheduled for a virtual visit with a [facility] provider today. Just as with appointments in the office, your consent must be obtained to participate. Your consent will be active for this visit and any virtual visit you may have with one of our providers in the next 365 days. If you have a MyChart account, a copy of this consent can be sent to you electronically.  As this is a virtual visit, video technology does not allow for your provider to perform a traditional examination. This may limit your provider's ability to fully assess your condition. If your provider identifies any concerns that need to be evaluated in person or the need to arrange testing (such as labs, EKG, etc.), we will make arrangements to do so. Although advances in technology are sophisticated, we cannot ensure that it will always work on either your end or our end. If the connection with a video visit is poor, the visit may have to be switched to a telephone visit. With either a video or telephone visit, we are not always able to ensure that we have a secure connection.  By engaging in this virtual visit, you consent to the provision of healthcare and authorize for your insurance to be billed (if applicable) for the services provided during this visit. Depending on your insurance coverage, you may receive a charge related to this service.  I need to obtain your verbal consent now. Are you willing to proceed with your visit today? Tracey Hebert has provided verbal consent on 12/18/2022 for a virtual visit (video or telephone). Tracey Finner, NP  Date: 12/18/2022 11:58 AM  Virtual Visit via Video Note   I, Tracey Hebert, connected with  Tracey Hebert  ([id number], 01/03/1996) on 12/18/22 at 12:00 PM EDT by a video-enabled telemedicine application and verified that I am speaking with the correct person using two identifiers.  Location: Patient:  Virtual Visit Location Patient: Home Provider: Virtual Visit Location Provider: Home Office   I discussed the limitations of evaluation and management by telemedicine and the availability of in person appointments. The patient expressed understanding and agreed to proceed.    History of Present Illness: Tracey Hebert is a 27 y.o. who identifies as a female who was assigned female at birth, and is being seen today for sinus congestion  Onset was congestion 7 days ago- and that went away but dry cough started Associated symptoms are cough with wheezing and taking breath away-causing shortness of breath  Modifying factors are OTC cough meds, mucinex, flonase and zyrtec daily Denies chest pain fevers, chills  History of asthma- out of rescue inhaler right now   Exposure to sick contacts- known- daughter is sick with similar COVID test: neg x 2    Problems:  Patient Active Problem List   Diagnosis Date Noted   Overweight with body mass index (BMI) of 28 to 28.9 in adult 02/06/2022   GAD (generalized anxiety disorder) 02/06/2022   Gastroesophageal reflux disease without esophagitis 12/22/2020   Rosacea 12/22/2020   Reactive airway disease 12/22/2020    Allergies: No Known Allergies Medications:  Current Outpatient Medications:    albuterol (VENTOLIN HFA) 108 (90 Base) MCG/ACT inhaler, Inhale 1-2 puffs into the lungs every 4 (four) hours as needed for wheezing or shortness of breath., Disp: 1 each, Rfl: 2   busPIRone (BUSPAR) 5 MG tablet, Take 1 tablet (5 mg total) by mouth 3 (three) times daily  as needed., Disp: 30 tablet, Rfl: 2   methylPREDNISolone (MEDROL) 4 MG TBPK tablet, Take as instructed, instructions on package, Disp: 21 tablet, Rfl: 0   norethindrone-ethinyl estradiol (LOESTRIN) 1-20 MG-MCG tablet, Take 1 tablet by mouth daily., Disp: 84 tablet, Rfl: 3   Oxymetazoline HCl (RHOFADE) 1 % CREA, Apply a thin coat to the face QAM, Disp: 30 g, Rfl:  5  Observations/Objective: Patient is well-developed, well-nourished in no acute distress.  Resting comfortably  at home.  Head is normocephalic, atraumatic.  No labored breathing.  Speech is clear and coherent with logical content.  Patient is alert and oriented at baseline.  Dry cough  Assessment and Plan:  1. Acute viral bronchitis  - brompheniramine-pseudoephedrine-DM 30-2-10 MG/5ML syrup; Take 5 mLs by mouth 4 (four) times daily as needed.  Dispense: 120 mL; Refill: 0  2. Mild intermittent reactive airway disease with acute exacerbation  - albuterol (VENTOLIN HFA) 108 (90 Base) MCG/ACT inhaler; Inhale 1-2 puffs into the lungs every 4 (four) hours as needed for wheezing or shortness of breath.  Dispense: 6.7 g; Refill: 2  -has medrol dose pack from dentist from Friday having a tooth procedure - has not started this - will start today to see if that will help as well as getting refill of inhaler and cough syrup for day time use -reports tessalon does not help her much  - Take meds as prescribed - Rest voice  - Use saline nose sprays frequently to help soothe nasal passages if they are drying out. - Stay hydrated by drinking plenty of fluids                 Reviewed side effects, risks and benefits of medication.    Patient acknowledged agreement and understanding of the plan.   Past Medical, Surgical, Social History, Allergies, and Medications have been Reviewed.    Follow Up Instructions: I discussed the assessment and treatment plan with the patient. The patient was provided an opportunity to ask questions and all were answered. The patient agreed with the plan and demonstrated an understanding of the instructions.  A copy of instructions were sent to the patient via MyChart unless otherwise noted below.     The patient was advised to call back or seek an in-person evaluation if the symptoms worsen or if the condition fails to improve as anticipated.  Time:  I  spent 10 minutes with the patient via telehealth technology discussing the above problems/concerns.    Tracey Finner, NP

## 2022-12-18 NOTE — Patient Instructions (Addendum)
Tracey Hebert, thank you for joining Freddy Finner, NP for today's virtual visit.  While this provider is not your primary care provider (PCP), if your PCP is located in our provider database this encounter information will be shared with them immediately following your visit.   A [place] MyChart account gives you access to today's visit and all your visits, tests, and labs performed at [facility] " click here if you don't have a [place] MyChart account or go to [email or web address]  Consent: (Patient) Tracey Hebert provided verbal consent for this virtual visit at the beginning of the encounter.  Current Medications:  Current Outpatient Medications:    brompheniramine-pseudoephedrine-DM 30-2-10 MG/5ML syrup, Take 5 mLs by mouth 4 (four) times daily as needed., Disp: 118 mL, Rfl: 0   albuterol (VENTOLIN HFA) 108 (90 Base) MCG/ACT inhaler, Inhale 1-2 puffs into the lungs every 4 (four) hours as needed for wheezing or shortness of breath., Disp: 6.7 g, Rfl: 2   busPIRone (BUSPAR) 5 MG tablet, Take 1 tablet (5 mg total) by mouth 3 (three) times daily as needed., Disp: 30 tablet, Rfl: 2   methylPREDNISolone (MEDROL) 4 MG TBPK tablet, Take as instructed, instructions on package, Disp: 21 tablet, Rfl: 0   norethindrone-ethinyl estradiol (LOESTRIN) 1-20 MG-MCG tablet, Take 1 tablet by mouth daily., Disp: 84 tablet, Rfl: 3   Oxymetazoline HCl (RHOFADE) 1 % CREA, Apply a thin coat to the face QAM, Disp: 30 g, Rfl: 5   Medications ordered in this encounter:  Meds ordered this encounter  Medications   albuterol (VENTOLIN HFA) 108 (90 Base) MCG/ACT inhaler    Sig: Inhale 1-2 puffs into the lungs every 4 (four) hours as needed for wheezing or shortness of breath.    Dispense:  6.7 g    Refill:  2    Order Specific Question:   Supervising Provider    Answer:   Merrilee Jansky [id number]   brompheniramine-pseudoephedrine-DM 30-2-10 MG/5ML syrup     Sig: Take 5 mLs by mouth 4 (four) times daily as needed.    Dispense:  118 mL    Refill:  0    Order Specific Question:   Supervising Provider    Answer:   Merrilee Jansky [[id number]]     *If you need refills on other medications prior to your next appointment, please contact your pharmacy*  Follow-Up: Call back or seek an in-person evaluation if the symptoms worsen or if the condition fails to improve as anticipated.  [facility] [phone]  Other Instructions - Take meds as prescribed   start medrol from dentist  - Rest voice  - Use saline nose sprays frequently to help soothe nasal passages if they are drying out. - Stay hydrated by drinking plenty of fluids                 If you have been instructed to have an in-person evaluation today at a local Urgent Care facility, please use the link below. It will take you to a list of all of our available [facility], including address, phone number and hours of operation. Please do not delay care.  [facility]  If you or a family member do not have a primary care provider, use the link below to schedule a visit and establish care. When you choose a [facility] primary care physician or advanced practice provider, you gain a long-term partner in health. Find a Primary  Care Provider  Learn more about [address]'s in-office and virtual care options: [facility]

## 2023-01-23 ENCOUNTER — Other Ambulatory Visit: Payer: Self-pay | Admitting: Oncology

## 2023-01-23 DIAGNOSIS — Z006 Encounter for examination for normal comparison and control in clinical research program: Secondary | ICD-10-CM

## 2023-01-30 ENCOUNTER — Other Ambulatory Visit
Admission: RE | Admit: 2023-01-30 | Discharge: 2023-01-30 | Disposition: A | Discharge status: Home / Self Care | Payer: Self-pay | Attending: Oncology | Admitting: Oncology

## 2023-01-30 DIAGNOSIS — Z006 Encounter for examination for normal comparison and control in clinical research program: Secondary | ICD-10-CM | POA: Insufficient documentation

## 2023-02-26 ENCOUNTER — Ambulatory Visit (INDEPENDENT_AMBULATORY_CARE_PROVIDER_SITE_OTHER): Payer: 59 | Admitting: Obstetrics & Gynecology

## 2023-02-26 ENCOUNTER — Encounter: Payer: Self-pay | Admitting: Obstetrics & Gynecology

## 2023-02-26 ENCOUNTER — Other Ambulatory Visit: Payer: Self-pay

## 2023-02-26 VITALS — BP 120/80 | Ht 63.0 in | Wt 174.0 lb

## 2023-02-26 DIAGNOSIS — Z01419 Encounter for gynecological examination (general) (routine) without abnormal findings: Secondary | ICD-10-CM

## 2023-02-26 DIAGNOSIS — Z3041 Encounter for surveillance of contraceptive pills: Secondary | ICD-10-CM

## 2023-02-26 MED ORDER — NORETHINDRONE ACET-ETHINYL EST 1-20 MG-MCG PO TABS
1.0000 | ORAL_TABLET | Freq: Every day | ORAL | 4 refills | Status: DC
  Filled 2023-02-26: qty 84, 84d supply, fill #0
  Filled 2023-06-04: qty 84, 84d supply, fill #1
  Filled 2023-07-29: qty 84, 84d supply, fill #2

## 2023-02-26 NOTE — Progress Notes (Signed)
GYNECOLOGY ANNUAL PHYSICAL EXAM PROGRESS NOTE  Subjective:    Tracey Hebert is a 27 y.o. married [id number] (34 month old) who presents for an annual exam. The patient has no complaints today. She wants to have her OCPs refilled. She takes them continuously so she doesn't have periods. The patient is sexually active. The patient participates in regular exercise: yes. Has the patient ever been transfused or tattooed?: no. The patient reports that there is not domestic violence in her life.    Menstrual History: Menarche age: 60 No LMP recorded.     Gynecologic History:  Contraception: OCP (estrogen/progesterone) History of STI's:  Last Pap: 2022. Results were: normal.  Denies h/o abnormal pap smears.     Upstream - 02/26/23 1331       Pregnancy Intention Screening   Does the patient want to become pregnant in the next year? No    Does the patient's partner want to become pregnant in the next year? No    Would the patient like to discuss contraceptive options today? No      Contraception Wrap Up   Current Method Oral Contraceptive    End Method Oral Contraceptive    Contraception Counseling Provided No               OB History  Gravida Para Term Preterm AB Living  2 1 1  0 1 1  SAB IAB Ectopic Multiple Live Births  1 0 0 0 1    # Outcome Date GA Lbr Len/2nd Weight Sex Type Anes PTL Lv  2 Term 08/24/21 [redacted]w[redacted]d / 02:31 8 lb 5.3 oz (3.78 kg) F Vag-Spont EPI  LIV     Name: MENDOZA,REBECCA     Apgar1: 9  Apgar5: 9  1 SAB             Past Medical History:  Diagnosis Date   Anxiety    Depression    GERD (gastroesophageal reflux disease)     Past Surgical History:  Procedure Laterality Date   TONSILLECTOMY     TONSILLECTOMY      Family History  Problem Relation Age of Onset   Depression Mother    Hypertension Father    Depression Father     Social History   Socioeconomic History   Marital status: Single    Spouse name: Rigoberto    Number of children: Not on file   Years of education: Not on file   Highest education level: Not on file  Occupational History   Not on file  Tobacco Use   Smoking status: Never   Smokeless tobacco: Never  Vaping Use   Vaping status: Never Used  Substance and Sexual Activity   Alcohol use: Not Currently   Drug use: Never   Sexual activity: Yes    Birth control/protection: None    Comment: undecided  Other Topics Concern   Not on file  Social History Narrative   Not on file   Social Determinants of Health   Financial Resource Strain: Not on file  Food Insecurity: Not on file  Transportation Needs: Not on file  Physical Activity: Not on file  Stress: Not on file  Social Connections: Not on file  Intimate Partner Violence: Not on file    Current Outpatient Medications on File Prior to Visit  Medication Sig Dispense Refill   albuterol (VENTOLIN HFA) 108 (90 Base) MCG/ACT inhaler Inhale 1-2 puffs into the lungs every 4 (four) hours as needed for wheezing or  shortness of breath. 6.7 g 2   No current facility-administered medications on file prior to visit.    No Known Allergies   Review of Systems  Constitutional: negative for chills, fatigue, fevers and sweats Eyes: negative for irritation, redness and visual disturbance Ears, nose, mouth, throat, and face: negative for hearing loss, nasal congestion, snoring and tinnitus Respiratory: negative for asthma, cough, sputum Cardiovascular: negative for chest pain, dyspnea, exertional chest pressure/discomfort, irregular heart beat, palpitations and syncope Gastrointestinal: negative for abdominal pain, change in bowel habits, nausea and vomiting Genitourinary: negative for abnormal menstrual periods, genital lesions, sexual problems and vaginal discharge, dysuria and urinary incontinence Integument/breast: negative for breast lump, breast tenderness and nipple discharge Hematologic/lymphatic: negative for bleeding and easy  bruising Musculoskeletal:negative for back pain and muscle weakness Neurological: negative for dizziness, headaches, vertigo and weakness Endocrine: negative for diabetic symptoms including polydipsia, polyuria and skin dryness Allergic/Immunologic: negative for hay fever and urticaria     She pumped breast milk for the first 4 months of her child's life.  Objective:  Blood pressure 120/80, height 5\' 3"  (1.6 m), weight 174 lb (78.9 kg), unknown if currently breastfeeding. Body mass index is 30.82 kg/m.  Well nourished, well hydrated Latina, no apparent distress  Heart- rrr Lungs- CTAB  She declines the remainder of the exam.                                                                .  Labs:  Lab Results  Component Value Date   WBC 6.6 02/06/2022   HGB 13.0 02/06/2022   HCT 40.7 02/06/2022   MCV 82.4 02/06/2022   PLT 365 02/06/2022    Lab Results  Component Value Date   CREATININE 0.80 02/06/2022   BUN 12 02/06/2022   NA 140 02/06/2022   K 3.9 02/06/2022   CL 106 02/06/2022   CO2 26 02/06/2022    Lab Results  Component Value Date   ALT 15 02/06/2022   AST 14 02/06/2022   ALKPHOS 49 01/12/2020   BILITOT 0.5 02/06/2022       Assessment:   1. Well woman exam with routine gynecological exam   2. Encounter for surveillance of contraceptive pills      Plan:  I have refilled her OCPs. She is not due for a pap or blood work. Follow up in 1 year for annual exam   Allie Bossier, MD [facility]

## 2023-03-27 ENCOUNTER — Other Ambulatory Visit: Payer: Self-pay

## 2023-03-27 ENCOUNTER — Encounter: Payer: Self-pay | Admitting: Certified Nurse Midwife

## 2023-03-27 ENCOUNTER — Ambulatory Visit (INDEPENDENT_AMBULATORY_CARE_PROVIDER_SITE_OTHER): Payer: 59 | Admitting: Certified Nurse Midwife

## 2023-03-27 VITALS — BP 123/77 | HR 57 | Ht 63.5 in | Wt 183.0 lb

## 2023-03-27 DIAGNOSIS — E669 Obesity, unspecified: Secondary | ICD-10-CM | POA: Diagnosis not present

## 2023-03-27 DIAGNOSIS — Z7689 Persons encountering health services in other specified circumstances: Secondary | ICD-10-CM

## 2023-03-27 DIAGNOSIS — Z6831 Body mass index (BMI) 31.0-31.9, adult: Secondary | ICD-10-CM | POA: Diagnosis not present

## 2023-03-27 DIAGNOSIS — Z713 Dietary counseling and surveillance: Secondary | ICD-10-CM | POA: Diagnosis not present

## 2023-03-27 MED ORDER — PHENTERMINE HCL 37.5 MG PO TABS
37.5000 mg | ORAL_TABLET | Freq: Every day | ORAL | 0 refills | Status: DC
Start: 2023-03-27 — End: 2023-08-14
  Filled 2023-03-27: qty 30, 30d supply, fill #0

## 2023-03-27 MED ORDER — CYANOCOBALAMIN 1000 MCG/ML IJ SOLN
1000.0000 ug | Freq: Once | INTRAMUSCULAR | 5 refills | Status: AC
  Filled 2023-03-27: qty 1, 1d supply, fill #0

## 2023-03-27 NOTE — Progress Notes (Signed)
Subjective:  Manaya Berteau is a 27 y.o. G2P1011 at Unknown being seen today for weight loss management- initial visit.  Patient reports General ROS: negative and reports previous weight loss attempts:  Onset was gradual over the past 2 years   .  Onset followed:  pregnancy . Associated symptoms include: fatigue, depression, anxiety, change in clothing fit. Current changes includes: exercise walking daily , pushing stroller. Diet changes. Stop eating out and cut back on sugar.  Pertinent medical history includes:  none   Risk factors include: none.  The patient has no significant surgical history Pertinent social history includes: none Today's evaluation includes : metabolic profile, hemoglobin A1c, thyroid panel  and lipid profile.   Past treatment has included: diet and exercise.   The following portions of the patient's history were reviewed and updated as appropriate: allergies, current medications, past family history, past medical history, past social history, past surgical history and problem list.   Objective:   Vitals:   03/27/23 1424  BP: 123/77  Pulse: (!) 57  Weight: 183 lb (83 kg)  Height: 5' 3.5" (1.613 m)    General:  Alert, oriented and cooperative. Patient is in no acute distress.  PE: Well groomed female in no current distress,   Mental Status: Normal mood and affect. Normal behavior. Normal judgment and thought content.   Current BMI: Body mass index is 31.91 kg/m.   Assessment and Plan:  Obesity  1. Encounter for weight management  - TSH + free T4 - Lipid Profile - Comprehensive metabolic panel - Hemoglobin A1c  2. Obesity (BMI 30-39.9) - TSH + free T4 - Lipid Profile - Comprehensive metabolic panel - Hemoglobin A1c   Plan: low carb, High protein diet RX for adipex 37.5 mg daily and B12 .ml monthly, to start now with first injection given at today's visit. Reviewed side-effects common to both medications and expected  outcomes. Increase daily water intake to at least 8 bottle a day, every day.  Goal is to reduse weight by 5 % by end of three months, and will re-evaluate then.  RTC in 4 weeks for Nurse visit to check weight & BP, and get next B12 injections.    Please refer to After Visit Summary for other counseling recommendations.    Doreene Burke, CNM      Consider the Low Glycemic Index Diet and 6 smaller meals daily .  This boosts your metabolism and regulates your sugars:   Use the protein bar by [employer]kins because they have lots of fiber in them  Find the low carb flatbreads, tortillas and pita breads for sandwiches:  Joseph's makes a pita bread and a flat bread , available at [facility] and [employer]; Toufayah makes a low carb flatbread available at [employer] and HT that is 9 net carbs and 100 cal Mission makes a low carb whole wheat tortilla available at [employer] most grocery stores with 6 net carbs and 210 cal  [place] yogurt can still have a lot of carbs .  Dannon Light N fit has 80 cal and 8 carbs

## 2023-03-28 MED ORDER — CYANOCOBALAMIN 1000 MCG/ML IJ SOLN
1000.0000 ug | Freq: Once | INTRAMUSCULAR | Status: AC
  Administered 2023-03-28: 1000 ug via INTRAMUSCULAR

## 2023-03-28 NOTE — Addendum Note (Signed)
Addended by: Fonda Kinder on: 03/28/2023 08:31 AM   Modules accepted: Orders

## 2023-04-02 ENCOUNTER — Other Ambulatory Visit: Payer: 59

## 2023-04-02 DIAGNOSIS — Z7689 Persons encountering health services in other specified circumstances: Secondary | ICD-10-CM | POA: Diagnosis not present

## 2023-04-02 DIAGNOSIS — E669 Obesity, unspecified: Secondary | ICD-10-CM | POA: Diagnosis not present

## 2023-04-03 ENCOUNTER — Other Ambulatory Visit (HOSPITAL_COMMUNITY): Payer: Self-pay

## 2023-04-03 LAB — COMPREHENSIVE METABOLIC PANEL
ALT: 17 [IU]/L (ref 0–32)
AST: 17 [IU]/L (ref 0–40)
Albumin: 4.4 g/dL (ref 4.0–5.0)
Alkaline Phosphatase: 64 [IU]/L (ref 44–121)
BUN/Creatinine Ratio: 20 (ref 9–23)
BUN: 14 mg/dL (ref 6–20)
Bilirubin Total: 0.3 mg/dL (ref 0.0–1.2)
CO2: 21 mmol/L (ref 20–29)
Calcium: 9.1 mg/dL (ref 8.7–10.2)
Chloride: 106 mmol/L (ref 96–106)
Creatinine, Ser: 0.7 mg/dL (ref 0.57–1.00)
Globulin, Total: 2.3 g/dL (ref 1.5–4.5)
Glucose: 88 mg/dL (ref 70–99)
Potassium: 4.2 mmol/L (ref 3.5–5.2)
Sodium: 139 mmol/L (ref 134–144)
Total Protein: 6.7 g/dL (ref 6.0–8.5)
eGFR: 120 mL/min/{1.73_m2} (ref >59)

## 2023-04-03 LAB — LIPID PANEL
Chol/HDL Ratio: 2.8 {ratio} (ref 0.0–4.4)
Cholesterol, Total: 215 mg/dL — ABNORMAL HIGH (ref 100–199)
HDL: 76 mg/dL (ref >39)
LDL Chol Calc (NIH): 118 mg/dL — ABNORMAL HIGH (ref 0–99)
Triglycerides: 118 mg/dL (ref 0–149)
VLDL Cholesterol Cal: 21 mg/dL (ref 5–40)

## 2023-04-03 LAB — TSH+FREE T4
Free T4: 1.1 ng/dL (ref 0.82–1.77)
TSH: 2.04 u[IU]/mL (ref 0.450–4.500)

## 2023-04-03 LAB — HEMOGLOBIN A1C
Est. average glucose Bld gHb Est-mCnc: 114 mg/dL
Hgb A1c MFr Bld: 5.6 % (ref 4.8–5.6)

## 2023-05-15 ENCOUNTER — Ambulatory Visit: Payer: 59

## 2023-05-15 ENCOUNTER — Other Ambulatory Visit (HOSPITAL_COMMUNITY)
Admission: RE | Admit: 2023-05-15 | Discharge: 2023-05-15 | Disposition: A | Discharge status: Home / Self Care | Payer: 59 | Source: Ambulatory Visit | Attending: Obstetrics | Admitting: Obstetrics

## 2023-05-15 ENCOUNTER — Ambulatory Visit (INDEPENDENT_AMBULATORY_CARE_PROVIDER_SITE_OTHER): Payer: 59

## 2023-05-15 VITALS — BP 114/77 | HR 80 | Wt 177.0 lb

## 2023-05-15 DIAGNOSIS — B9689 Other specified bacterial agents as the cause of diseases classified elsewhere: Secondary | ICD-10-CM | POA: Diagnosis not present

## 2023-05-15 DIAGNOSIS — N760 Acute vaginitis: Secondary | ICD-10-CM | POA: Diagnosis not present

## 2023-05-15 NOTE — Progress Notes (Signed)
    NURSE VISIT NOTE  Subjective:    Patient ID: Tracey Hebert, female    DOB: [birth year], 27 y.o.   MRN: [id number]  HPI  Patient is a 27 y.o. G46P1011 female who presents for vaginal irritation for one week, patient states that she used a scented bath bomb and has had irritation since.  Denies abnormal vaginal bleeding or significant pelvic pain or fever. denies dysuria, hematuria, urinary frequency, urinary urgency, flank pain, abdominal pain, pelvic pain, cloudy malordorous urine, genital rash, and vaginal discharge. Patient denies history of known exposure to STD.   Objective:    BP 114/77   Pulse 80   Wt 177 lb (80.3 kg)   Breastfeeding No   BMI 30.86 kg/m      Assessment:   1. Acute vaginitis     nonspecific vaginitis  Plan:   GC and chlamydia DNA  probe sent to lab. Treatment: abstain from coitus during course of treatment and await results of cervicovaginal swab.  ROV prn if symptoms persist or worsen.   Fonda Kinder, CMA

## 2023-05-17 ENCOUNTER — Other Ambulatory Visit: Payer: Self-pay | Admitting: Obstetrics

## 2023-05-17 ENCOUNTER — Other Ambulatory Visit: Payer: Self-pay

## 2023-05-17 LAB — CERVICOVAGINAL ANCILLARY ONLY
Bacterial Vaginitis (gardnerella): NEGATIVE
Candida Glabrata: NEGATIVE
Candida Vaginitis: NEGATIVE
Chlamydia: NEGATIVE
Comment: NEGATIVE
Comment: NEGATIVE
Comment: NEGATIVE
Comment: NEGATIVE
Comment: NEGATIVE
Comment: NORMAL
Neisseria Gonorrhea: NEGATIVE
Trichomonas: NEGATIVE

## 2023-05-17 MED ORDER — FLUCONAZOLE 150 MG PO TABS
150.0000 mg | ORAL_TABLET | Freq: Once | ORAL | 1 refills | Status: AC
  Filled 2023-05-17: qty 1, 1d supply, fill #0

## 2023-06-04 ENCOUNTER — Telehealth: Payer: 59 | Admitting: Physician Assistant

## 2023-06-04 ENCOUNTER — Other Ambulatory Visit: Payer: Self-pay

## 2023-06-04 DIAGNOSIS — J40 Bronchitis, not specified as acute or chronic: Secondary | ICD-10-CM | POA: Diagnosis not present

## 2023-06-04 MED ORDER — BENZONATATE 100 MG PO CAPS
100.0000 mg | ORAL_CAPSULE | Freq: Three times a day (TID) | ORAL | 0 refills | Status: DC | PRN
  Filled 2023-06-04: qty 20, 7d supply, fill #0

## 2023-06-04 MED ORDER — AZITHROMYCIN 250 MG PO TABS
ORAL_TABLET | ORAL | 0 refills | Status: AC
  Filled 2023-06-04 (×2): qty 6, 5d supply, fill #0

## 2023-06-04 NOTE — Progress Notes (Signed)
 E-Visit for Cough   We are sorry that you are not feeling well.  Here is how we plan to help!  Based on your presentation I believe you most likely have A cough due to bacteria.  When patients have a fever and a productive cough with a change in color or increased sputum production, we are concerned about bacterial bronchitis.  If left untreated it can progress to pneumonia.  If your symptoms do not improve with your treatment plan it is important that you contact your provider.   I have prescribed Azithromyin 250 mg: two tablets now and then one tablet daily for 4 additonal days    In addition you may use A non-prescription cough medication called Robitussin DAC. Take 2 teaspoons every 8 hours or Delsym: take 2 teaspoons every 12 hours., A non-prescription cough medication called Mucinex DM: take 2 tablets every 12 hours., and A prescription cough medication called Tessalon Perles 100mg . You may take 1-2 capsules every 8 hours as needed for your cough.    From your responses in the eVisit questionnaire you describe inflammation in the upper respiratory tract which is causing a significant cough.  This is commonly called Bronchitis and has four common causes:   Allergies Viral Infections Acid Reflux Bacterial Infection Allergies, viruses and acid reflux are treated by controlling symptoms or eliminating the cause. An example might be a cough caused by taking certain blood pressure medications. You stop the cough by changing the medication. Another example might be a cough caused by acid reflux. Controlling the reflux helps control the cough.  USE OF BRONCHODILATOR ("RESCUE") INHALERS: There is a risk from using your bronchodilator too frequently.  The risk is that over-reliance on a medication which only relaxes the muscles surrounding the breathing tubes can reduce the effectiveness of medications prescribed to reduce swelling and congestion of the tubes themselves.  Although you feel brief  relief from the bronchodilator inhaler, your asthma may actually be worsening with the tubes becoming more swollen and filled with mucus.  This can delay other crucial treatments, such as oral steroid medications. If you need to use a bronchodilator inhaler daily, several times per day, you should discuss this with your provider.  There are probably better treatments that could be used to keep your asthma under control.     HOME CARE Only take medications as instructed by your medical team. Complete the entire course of an antibiotic. Drink plenty of fluids and get plenty of rest. Avoid close contacts especially the very young and the elderly Cover your mouth if you cough or cough into your sleeve. Always remember to wash your hands A steam or ultrasonic humidifier can help congestion.   GET HELP RIGHT AWAY IF: You develop worsening fever. You become short of breath You cough up blood. Your symptoms persist after you have completed your treatment plan MAKE SURE YOU  Understand these instructions. Will watch your condition. Will get help right away if you are not doing well or get worse.    Thank you for choosing an e-visit.  Your e-visit answers were reviewed by a board certified advanced clinical practitioner to complete your personal care plan. Depending upon the condition, your plan could have included both over the counter or prescription medications.  Please review your pharmacy choice. Make sure the pharmacy is open so you can pick up prescription now. If there is a problem, you may contact your provider through MyChart messaging and have the prescription routed to another  pharmacy.  Your safety is important to [place]. If you have drug allergies check your prescription carefully.   For the next 24 hours you can use MyChart to ask questions about today's visit, request a non-urgent call back, or ask for a work or school excuse. You will get an email in the next two days asking about  your experience. I hope that your e-visit has been valuable and will speed your recovery.   I have spent 5 minutes in review of e-visit questionnaire, review and updating patient chart, medical decision making and response to patient.   Tylene Fantasia Ward, PA-C

## 2023-06-06 ENCOUNTER — Other Ambulatory Visit (HOSPITAL_COMMUNITY): Payer: Self-pay

## 2023-06-10 ENCOUNTER — Other Ambulatory Visit (INDEPENDENT_AMBULATORY_CARE_PROVIDER_SITE_OTHER): Payer: 59

## 2023-06-10 DIAGNOSIS — G43809 Other migraine, not intractable, without status migrainosus: Secondary | ICD-10-CM | POA: Diagnosis not present

## 2023-06-10 MED ORDER — KETOROLAC TROMETHAMINE 60 MG/2ML IM SOLN: 60.0000 mg | Freq: Once | INTRAMUSCULAR | Status: DC

## 2023-06-10 MED ORDER — KETOROLAC TROMETHAMINE 30 MG/ML IJ SOLN
30.0000 mg | Freq: Once | INTRAMUSCULAR | Status: AC
  Administered 2023-06-10: 30 mg via INTRAMUSCULAR

## 2023-06-10 NOTE — Addendum Note (Signed)
Addended by: Sheliah Hatch on: 06/10/2023 11:54 AM   Modules accepted: Orders

## 2023-07-30 ENCOUNTER — Other Ambulatory Visit: Payer: Self-pay

## 2023-07-31 ENCOUNTER — Other Ambulatory Visit: Payer: Self-pay

## 2023-07-31 MED ORDER — NORETHINDRONE ACET-ETHINYL EST 1-20 MG-MCG PO TABS
1.0000 | ORAL_TABLET | Freq: Every day | ORAL | 4 refills | Status: DC
  Filled 2023-07-31: qty 84, 84d supply, fill #0

## 2023-08-01 ENCOUNTER — Other Ambulatory Visit: Payer: Self-pay

## 2023-08-01 ENCOUNTER — Other Ambulatory Visit: Payer: Self-pay | Admitting: Certified Nurse Midwife

## 2023-08-01 MED ORDER — NORETHINDRONE ACET-ETHINYL EST 1-20 MG-MCG PO TABS
1.0000 | ORAL_TABLET | Freq: Every day | ORAL | 4 refills | Status: AC
  Filled 2023-08-01 (×2): qty 84, 84d supply, fill #0
  Filled 2023-08-01: qty 21, 21d supply, fill #0
  Filled 2023-08-01: qty 84, 84d supply, fill #0
  Filled 2023-08-12: qty 84, 84d supply, fill #1
  Filled 2023-11-14: qty 84, 84d supply, fill #2
  Filled 2024-02-13 – 2024-05-05 (×2): qty 84, 84d supply, fill #3
  Filled 2024-07-25: qty 84, 84d supply, fill #4

## 2023-08-12 ENCOUNTER — Other Ambulatory Visit: Payer: Self-pay

## 2023-08-14 ENCOUNTER — Telehealth: Payer: 59 | Admitting: Family

## 2023-08-14 ENCOUNTER — Other Ambulatory Visit: Payer: Self-pay

## 2023-08-14 DIAGNOSIS — J209 Acute bronchitis, unspecified: Secondary | ICD-10-CM | POA: Diagnosis not present

## 2023-08-14 DIAGNOSIS — J4520 Mild intermittent asthma, uncomplicated: Secondary | ICD-10-CM | POA: Diagnosis not present

## 2023-08-14 MED ORDER — PREDNISONE 10 MG PO TABS
ORAL_TABLET | ORAL | 0 refills | Status: AC
  Filled 2023-08-14: qty 21, 6d supply, fill #0

## 2023-08-14 MED ORDER — PROMETHAZINE-DM 6.25-15 MG/5ML PO SYRP
5.0000 mL | ORAL_SOLUTION | Freq: Three times a day (TID) | ORAL | 0 refills | Status: DC | PRN
  Filled 2023-08-14: qty 118, 8d supply, fill #0

## 2023-08-14 NOTE — Progress Notes (Signed)
E-Visit for Cough   We are sorry that you are not feeling well.  Here is how we plan to help!  Based on your presentation I believe you most likely have A cough due to a virus.  This is called viral bronchitis and is best treated by rest, plenty of fluids and control of the cough.  You may use Ibuprofen or Tylenol as directed to help your symptoms.     In addition you may use A non-prescription cough medication called Robitussin DAC. Take 2 teaspoons every 8 hours or Delsym: take 2 teaspoons every 12 hours. and A non-prescription cough medication called Mucinex DM: take 2 tablets every 12 hours. I have sent in promethazine- DM cough syrup.   Prednisone 10 mg daily for 6 days (see taper instructions below)  Directions for 6 day taper: Day 1: 2 tablets before breakfast, 1 after both lunch & dinner and 2 at bedtime Day 2: 1 tab before breakfast, 1 after both lunch & dinner and 2 at bedtime Day 3: 1 tab at each meal & 1 at bedtime Day 4: 1 tab at breakfast, 1 at lunch, 1 at bedtime Day 5: 1 tab at breakfast & 1 tab at bedtime Day 6: 1 tab at breakfast  From your responses in the eVisit questionnaire you describe inflammation in the upper respiratory tract which is causing a significant cough.  This is commonly called Bronchitis and has four common causes:   Allergies Viral Infections Acid Reflux Bacterial Infection Allergies, viruses and acid reflux are treated by controlling symptoms or eliminating the cause. An example might be a cough caused by taking certain blood pressure medications. You stop the cough by changing the medication. Another example might be a cough caused by acid reflux. Controlling the reflux helps control the cough.  USE OF BRONCHODILATOR ("RESCUE") INHALERS: There is a risk from using your bronchodilator too frequently.  The risk is that over-reliance on a medication which only relaxes the muscles surrounding the breathing tubes can reduce the effectiveness of  medications prescribed to reduce swelling and congestion of the tubes themselves.  Although you feel brief relief from the bronchodilator inhaler, your asthma may actually be worsening with the tubes becoming more swollen and filled with mucus.  This can delay other crucial treatments, such as oral steroid medications. If you need to use a bronchodilator inhaler daily, several times per day, you should discuss this with your provider.  There are probably better treatments that could be used to keep your asthma under control.     HOME CARE Only take medications as instructed by your medical team. Complete the entire course of an antibiotic. Drink plenty of fluids and get plenty of rest. Avoid close contacts especially the very young and the elderly Cover your mouth if you cough or cough into your sleeve. Always remember to wash your hands A steam or ultrasonic humidifier can help congestion.   GET HELP RIGHT AWAY IF: You develop worsening fever. You become short of breath You cough up blood. Your symptoms persist after you have completed your treatment plan MAKE SURE YOU  Understand these instructions. Will watch your condition. Will get help right away if you are not doing well or get worse.    Thank you for choosing an e-visit.  Your e-visit answers were reviewed by a board certified advanced clinical practitioner to complete your personal care plan. Depending upon the condition, your plan could have included both over the counter or prescription medications.  Please review your pharmacy choice. Make sure the pharmacy is open so you can pick up prescription now. If there is a problem, you may contact your provider through [employer] and have the prescription routed to another pharmacy.  Your safety is important to [place]. If you have drug allergies check your prescription carefully.   For the next 24 hours you can use MyChart to ask questions about today's visit, request a non-urgent  call back, or ask for a work or school excuse. You will get an email in the next two days asking about your experience. I hope that your e-visit has been valuable and will speed your recovery.  Approximately 5 minutes was spent documenting and reviewing patient's chart.

## 2023-09-09 ENCOUNTER — Telehealth: Admitting: Physician Assistant

## 2023-09-09 DIAGNOSIS — J069 Acute upper respiratory infection, unspecified: Secondary | ICD-10-CM | POA: Diagnosis not present

## 2023-09-09 DIAGNOSIS — J040 Acute laryngitis: Secondary | ICD-10-CM | POA: Diagnosis not present

## 2023-09-09 DIAGNOSIS — H60393 Other infective otitis externa, bilateral: Secondary | ICD-10-CM

## 2023-09-09 MED ORDER — BENZONATATE 100 MG PO CAPS: 100.0000 mg | ORAL_CAPSULE | Freq: Three times a day (TID) | ORAL | 0 refills | Status: DC | PRN

## 2023-09-09 MED ORDER — IPRATROPIUM BROMIDE 0.03 % NA SOLN
2.0000 | Freq: Two times a day (BID) | NASAL | 0 refills | Status: AC
Start: 2023-09-09 — End: ?

## 2023-09-09 MED ORDER — CIPROFLOXACIN-DEXAMETHASONE 0.3-0.1 % OT SUSP: 4.0000 [drp] | Freq: Two times a day (BID) | OTIC | 0 refills | Status: DC

## 2023-09-09 NOTE — Addendum Note (Signed)
 Addended by: Margaretann Loveless on: 09/09/2023 08:43 AM   Modules accepted: Orders

## 2023-09-09 NOTE — Progress Notes (Signed)
 E-Visit for Upper Respiratory Infection   We are sorry you are not feeling well.  Here is how we plan to help!  Based on what you have shared with me, it looks like you may have a viral upper respiratory infection.  Upper respiratory infections are caused by a large number of viruses; however, rhinovirus is the most common cause.   Symptoms vary from person to person, with common symptoms including sore throat, cough, fatigue or lack of energy and feeling of general discomfort.  A low-grade fever of up to 100.4 may present, but is often uncommon.  Symptoms vary however, and are closely related to a person's age or underlying illnesses.  The most common symptoms associated with an upper respiratory infection are nasal discharge or congestion, cough, sneezing, headache and pressure in the ears and face.  These symptoms usually persist for about 3 to 10 days, but can last up to 2 weeks.  It is important to know that upper respiratory infections do not cause serious illness or complications in most cases.    Upper respiratory infections can be transmitted from person to person, with the most common method of transmission being a person's hands.  The virus is able to live on the skin and can infect other persons for up to 2 hours after direct contact.  Also, these can be transmitted when someone coughs or sneezes; thus, it is important to cover the mouth to reduce this risk.  To keep the spread of the illness at bay, good hand hygiene is very important.  This is an infection that is most likely caused by a virus. There are no specific treatments other than to help you with the symptoms until the infection runs its course.  We are sorry you are not feeling well.  Here is how we plan to help!   For nasal congestion, you may use an oral decongestants such as Mucinex D or if you have glaucoma or high blood pressure use plain Mucinex.  Saline nasal spray or nasal drops can help and can safely be used as often as  needed for congestion.  For your congestion, I have prescribed Ipratropium Bromide nasal spray 0.03% two sprays in each nostril 2-3 times a day  If you do not have a history of heart disease, hypertension, diabetes or thyroid disease, prostate/bladder issues or glaucoma, you may also use Sudafed to treat nasal congestion.  It is highly recommended that you consult with a pharmacist or your primary care physician to ensure this medication is safe for you to take.     If you have a cough, you may use cough suppressants such as Delsym and Robitussin.  If you have glaucoma or high blood pressure, you can also use Coricidin HBP.   For cough I have prescribed for you A prescription cough medication called Tessalon Perles 100 mg. You may take 1-2 capsules every 8 hours as needed for cough  If you have a sore or scratchy throat, use a saltwater gargle-  to  teaspoon of salt dissolved in a 4-ounce to 8-ounce glass of warm water.  Gargle the solution for approximately 15-30 seconds and then spit.  It is important not to swallow the solution.  You can also use throat lozenges/cough drops and Chloraseptic spray to help with throat pain or discomfort.  Warm or cold liquids can also be helpful in relieving throat pain.  For headache, pain or general discomfort, you can use Ibuprofen or Tylenol as directed.  Some authorities believe that zinc sprays or the use of Echinacea may shorten the course of your symptoms.  I will add care advise for Laryngitis following.   HOME CARE Only take medications as instructed by your medical team. Be sure to drink plenty of fluids. Water is fine as well as fruit juices, sodas and electrolyte beverages. You may want to stay away from caffeine or alcohol. If you are nauseated, try taking small sips of liquids. How do you know if you are getting enough fluid? Your urine should be a pale yellow or almost colorless. Get rest. Taking a steamy shower or using a humidifier may help  nasal congestion and ease sore throat pain. You can place a towel over your head and breathe in the steam from hot water coming from a faucet. Using a saline nasal spray works much the same way. Cough drops, hard candies and sore throat lozenges may ease your cough. Avoid close contacts especially the very young and the elderly Cover your mouth if you cough or sneeze Always remember to wash your hands.   GET HELP RIGHT AWAY IF: You develop worsening fever. If your symptoms do not improve within 10 days You develop yellow or green discharge from your nose over 3 days. You have coughing fits You develop a severe head ache or visual changes. You develop shortness of breath, difficulty breathing or start having chest pain Your symptoms persist after you have completed your treatment plan  MAKE SURE YOU  Understand these instructions. Will watch your condition. Will get help right away if you are not doing well or get worse.  Thank you for choosing an e-visit.  Your e-visit answers were reviewed by a board certified advanced clinical practitioner to complete your personal care plan. Depending upon the condition, your plan could have included both over the counter or prescription medications.  Please review your pharmacy choice. Make sure the pharmacy is open so you can pick up prescription now. If there is a problem, you may contact your provider through [employer] and have the prescription routed to another pharmacy.  Your safety is important to [place]. If you have drug allergies check your prescription carefully.   For the next 24 hours you can use MyChart to ask questions about today's visit, request a non-urgent call back, or ask for a work or school excuse. You will get an email in the next two days asking about your experience. I hope that your e-visit has been valuable and will speed your recovery.    I have spent 5 minutes in review of e-visit questionnaire, review and updating  patient chart, medical decision making and response to patient.   Margaretann Loveless, PA-C

## 2023-09-10 ENCOUNTER — Other Ambulatory Visit (HOSPITAL_COMMUNITY): Payer: Self-pay

## 2023-09-10 ENCOUNTER — Other Ambulatory Visit: Payer: Self-pay

## 2023-09-10 MED ORDER — AMOXICILLIN 875 MG PO TABS: 875.0000 mg | ORAL_TABLET | Freq: Two times a day (BID) | ORAL | 0 refills | Status: DC

## 2023-09-10 MED ORDER — AMOXICILLIN 875 MG PO TABS
875.0000 mg | ORAL_TABLET | Freq: Two times a day (BID) | ORAL | 0 refills | Status: DC
  Filled 2023-09-10: qty 14, 7d supply, fill #0

## 2023-09-10 NOTE — Addendum Note (Signed)
 Addended by: Waldon Merl on: 09/10/2023 08:16 AM   Modules accepted: Orders

## 2023-09-10 NOTE — Addendum Note (Signed)
 Addended by: Waldon Merl on: 09/10/2023 08:28 AM   Modules accepted: Orders

## 2023-10-09 DIAGNOSIS — Z1389 Encounter for screening for other disorder: Secondary | ICD-10-CM | POA: Diagnosis not present

## 2023-10-09 DIAGNOSIS — J3089 Other allergic rhinitis: Secondary | ICD-10-CM | POA: Diagnosis not present

## 2023-10-09 DIAGNOSIS — R6889 Other general symptoms and signs: Secondary | ICD-10-CM | POA: Diagnosis not present

## 2023-11-26 ENCOUNTER — Other Ambulatory Visit: Payer: Self-pay

## 2023-11-29 ENCOUNTER — Other Ambulatory Visit: Payer: Self-pay | Admitting: Certified Nurse Midwife

## 2023-11-29 ENCOUNTER — Other Ambulatory Visit (HOSPITAL_COMMUNITY)
Admission: RE | Admit: 2023-11-29 | Discharge: 2023-11-29 | Disposition: A | Discharge status: Home / Self Care | Source: Ambulatory Visit | Attending: Certified Nurse Midwife | Admitting: Certified Nurse Midwife

## 2023-11-29 ENCOUNTER — Other Ambulatory Visit: Payer: Self-pay

## 2023-11-29 DIAGNOSIS — R300 Dysuria: Secondary | ICD-10-CM

## 2023-11-29 MED ORDER — NITROFURANTOIN MONOHYD MACRO 100 MG PO CAPS
100.0000 mg | ORAL_CAPSULE | Freq: Two times a day (BID) | ORAL | 1 refills | Status: DC
  Filled 2023-11-29: qty 14, 7d supply, fill #0
  Filled 2024-02-13 – 2024-05-05 (×2): qty 14, 7d supply, fill #1

## 2023-12-01 LAB — URINE CULTURE: Organism ID, Bacteria: NO GROWTH

## 2023-12-03 LAB — CERVICOVAGINAL ANCILLARY ONLY
Bacterial Vaginitis (gardnerella): NEGATIVE
Candida Glabrata: NEGATIVE
Candida Vaginitis: NEGATIVE
Chlamydia: NEGATIVE
Comment: NEGATIVE
Comment: NEGATIVE
Comment: NEGATIVE
Comment: NEGATIVE
Comment: NEGATIVE
Comment: NORMAL
Neisseria Gonorrhea: NEGATIVE
Trichomonas: NEGATIVE

## 2024-02-13 ENCOUNTER — Other Ambulatory Visit: Payer: Self-pay

## 2024-02-24 ENCOUNTER — Other Ambulatory Visit: Payer: Self-pay

## 2024-04-18 ENCOUNTER — Other Ambulatory Visit: Payer: Self-pay

## 2024-04-18 MED ORDER — METHYLPREDNISOLONE 4 MG PO TBPK
ORAL_TABLET | ORAL | 0 refills | Status: DC
  Filled 2024-04-18 – 2024-05-05 (×2): qty 21, 6d supply, fill #0

## 2024-04-28 ENCOUNTER — Other Ambulatory Visit: Payer: Self-pay

## 2024-05-05 ENCOUNTER — Other Ambulatory Visit: Payer: Self-pay

## 2024-05-05 ENCOUNTER — Telehealth: Admitting: Physician Assistant

## 2024-05-05 DIAGNOSIS — B9689 Other specified bacterial agents as the cause of diseases classified elsewhere: Secondary | ICD-10-CM | POA: Diagnosis not present

## 2024-05-05 DIAGNOSIS — J208 Acute bronchitis due to other specified organisms: Secondary | ICD-10-CM

## 2024-05-05 MED ORDER — PREDNISONE 20 MG PO TABS
40.0000 mg | ORAL_TABLET | Freq: Every day | ORAL | 0 refills | Status: DC
  Filled 2024-05-05: qty 10, 5d supply, fill #0

## 2024-05-05 MED ORDER — BENZONATATE 100 MG PO CAPS
100.0000 mg | ORAL_CAPSULE | Freq: Three times a day (TID) | ORAL | 0 refills | Status: DC | PRN
  Filled 2024-05-05: qty 30, 10d supply, fill #0

## 2024-05-05 MED ORDER — AZITHROMYCIN 250 MG PO TABS
ORAL_TABLET | ORAL | 0 refills | Status: AC
  Filled 2024-05-05: qty 6, 5d supply, fill #0

## 2024-05-05 NOTE — Progress Notes (Signed)
 We are sorry that you are not feeling well.  Here is how we plan to help!  Based on your presentation I believe you most likely have A cough due to bacteria.  When patients have a fever and a productive cough with a change in color or increased sputum production, we are concerned about bacterial bronchitis.  If left untreated it can progress to pneumonia.  If your symptoms do not improve with your treatment plan it is important that you contact your provider.   I have prescribed Azithromyin 250 mg: two tablets now and then one tablet daily for 4 additonal days    In addition you may use A prescription cough medication called Tessalon  Perles 100mg . You may take 1-2 capsules every 8 hours as needed for your cough.  I have also sent in a short script of prednisone  to take as directed.  From your responses in the eVisit questionnaire you describe inflammation in the upper respiratory tract which is causing a significant cough.  This is commonly called Bronchitis and has four common causes:   Allergies Viral Infections Acid Reflux Bacterial Infection Allergies, viruses and acid reflux are treated by controlling symptoms or eliminating the cause. An example might be a cough caused by taking certain blood pressure medications. You stop the cough by changing the medication. Another example might be a cough caused by acid reflux. Controlling the reflux helps control the cough.  USE OF BRONCHODILATOR (RESCUE) INHALERS: There is a risk from using your bronchodilator too frequently.  The risk is that over-reliance on a medication which only relaxes the muscles surrounding the breathing tubes can reduce the effectiveness of medications prescribed to reduce swelling and congestion of the tubes themselves.  Although you feel brief relief from the bronchodilator inhaler, your asthma may actually be worsening with the tubes becoming more swollen and filled with mucus.  This can delay other crucial treatments, such  as oral steroid medications. If you need to use a bronchodilator inhaler daily, several times per day, you should discuss this with your provider.  There are probably better treatments that could be used to keep your asthma under control.     HOME CARE Only take medications as instructed by your medical team. Complete the entire course of an antibiotic. Drink plenty of fluids and get plenty of rest. Avoid close contacts especially the very young and the elderly Cover your mouth if you cough or cough into your sleeve. Always remember to wash your hands A steam or ultrasonic humidifier can help congestion.   GET HELP RIGHT AWAY IF: You develop worsening fever. You become short of breath You cough up blood. Your symptoms persist after you have completed your treatment plan MAKE SURE YOU  Understand these instructions. Will watch your condition. Will get help right away if you are not doing well or get worse.  Your e-visit answers were reviewed by a board certified advanced clinical practitioner to complete your personal care plan.  Depending on the condition, your plan could have included both over the counter or prescription medications. If there is a problem please reply  once you have received a response from your provider. Your safety is important to us .  If you have drug allergies check your prescription carefully.    You can use MyChart to ask questions about today's visit, request a non-urgent call back, or ask for a work or school excuse for 24 hours related to this e-Visit. If it has been greater than 24 hours  you will need to follow up with your provider, or enter a new e-Visit to address those concerns. You will get an e-mail in the next two days asking about your experience.  I hope that your e-visit has been valuable and will speed your recovery. Thank you for using e-visits.   I have spent 5 minutes in review of e-visit questionnaire, review and updating patient chart, medical  decision making and response to patient.   Elsie Velma Lunger, PA-C

## 2024-05-07 ENCOUNTER — Other Ambulatory Visit: Payer: Self-pay

## 2024-05-07 MED ORDER — PSEUDOEPH-BROMPHEN-DM 30-2-10 MG/5ML PO SYRP
5.0000 mL | ORAL_SOLUTION | Freq: Four times a day (QID) | ORAL | 0 refills | Status: DC | PRN
  Filled 2024-05-07: qty 120, 6d supply, fill #0

## 2024-05-07 NOTE — Addendum Note (Signed)
 Addended by: VIVIENNE DELON HERO on: 05/07/2024 08:41 AM   Modules accepted: Orders

## 2024-05-21 ENCOUNTER — Other Ambulatory Visit (HOSPITAL_COMMUNITY)
Admission: RE | Admit: 2024-05-21 | Discharge: 2024-05-21 | Disposition: A | Discharge status: Home / Self Care | Source: Ambulatory Visit | Attending: Obstetrics | Admitting: Obstetrics

## 2024-05-21 ENCOUNTER — Ambulatory Visit (INDEPENDENT_AMBULATORY_CARE_PROVIDER_SITE_OTHER)

## 2024-05-21 VITALS — BP 124/72 | HR 74 | Wt 180.0 lb

## 2024-05-21 DIAGNOSIS — N949 Unspecified condition associated with female genital organs and menstrual cycle: Secondary | ICD-10-CM | POA: Diagnosis present

## 2024-05-21 LAB — POCT URINALYSIS DIPSTICK
Bilirubin, UA: NEGATIVE
Blood, UA: POSITIVE
Glucose, UA: NEGATIVE
Ketones, UA: NEGATIVE
Nitrite, UA: NEGATIVE
Protein, UA: POSITIVE — AB
Spec Grav, UA: 1.02 (ref 1.010–1.025)
Urobilinogen, UA: 0.2 U/dL
pH, UA: 5 (ref 5.0–8.0)

## 2024-05-22 LAB — CERVICOVAGINAL ANCILLARY ONLY
Bacterial Vaginitis (gardnerella): NEGATIVE
Candida Glabrata: NEGATIVE
Candida Vaginitis: NEGATIVE
Chlamydia: NEGATIVE
Comment: NEGATIVE
Comment: NEGATIVE
Comment: NEGATIVE
Comment: NEGATIVE
Comment: NEGATIVE
Comment: NORMAL
Neisseria Gonorrhea: NEGATIVE
Trichomonas: NEGATIVE

## 2024-05-23 ENCOUNTER — Telehealth: Admitting: Physician Assistant

## 2024-05-23 DIAGNOSIS — J069 Acute upper respiratory infection, unspecified: Secondary | ICD-10-CM

## 2024-05-23 LAB — URINE CULTURE

## 2024-05-23 NOTE — Progress Notes (Signed)
  Because you were recently seen with similar symptoms that have not improved, I feel your condition warrants further evaluation and I recommend that you be seen in a face-to-face visit.   NOTE: There will be NO CHARGE for this E-Visit   If you are having a true medical emergency, please call 911.     For an urgent face to face visit, [place] has multiple urgent care centers for your convenience.  Click the link below for the full list of locations and hours, walk-in wait times, appointment scheduling options and driving directions:  Urgent Care - [place], [place], [place], [place], [place], [place]  [place]     Your MyChart E-visit questionnaire answers were reviewed by a board certified advanced clinical practitioner to complete your personal care plan based on your specific symptoms.    Thank you for using e-Visits.

## 2024-05-25 ENCOUNTER — Other Ambulatory Visit: Payer: Self-pay

## 2024-05-25 ENCOUNTER — Ambulatory Visit
Admission: EM | Admit: 2024-05-25 | Discharge: 2024-05-25 | Disposition: A | Discharge status: Home / Self Care | Attending: Emergency Medicine | Admitting: Emergency Medicine

## 2024-05-25 DIAGNOSIS — J069 Acute upper respiratory infection, unspecified: Secondary | ICD-10-CM | POA: Diagnosis not present

## 2024-05-25 DIAGNOSIS — H6691 Otitis media, unspecified, right ear: Secondary | ICD-10-CM

## 2024-05-25 DIAGNOSIS — J4521 Mild intermittent asthma with (acute) exacerbation: Secondary | ICD-10-CM | POA: Diagnosis not present

## 2024-05-25 MED ORDER — PREDNISONE 10 MG PO TABS
40.0000 mg | ORAL_TABLET | Freq: Every day | ORAL | 0 refills | Status: AC
  Filled 2024-05-25: qty 20, 5d supply, fill #0

## 2024-05-25 MED ORDER — ALBUTEROL SULFATE HFA 108 (90 BASE) MCG/ACT IN AERS
1.0000 | INHALATION_SPRAY | Freq: Four times a day (QID) | RESPIRATORY_TRACT | 0 refills | Status: AC | PRN
Start: 2024-05-25 — End: ?
  Filled 2024-05-25: qty 6.7, 25d supply, fill #0

## 2024-05-25 MED ORDER — AMOXICILLIN-POT CLAVULANATE 875-125 MG PO TABS
1.0000 | ORAL_TABLET | Freq: Two times a day (BID) | ORAL | 0 refills | Status: DC
  Filled 2024-05-25: qty 14, 7d supply, fill #0

## 2024-05-25 NOTE — ED Provider Notes (Signed)
 Tracey Hebert    CSN: [id number] Arrival date & time: 05/25/24  1712      History   Chief Complaint Chief Complaint  Patient presents with   Cough   Wheezing    HPI Tracey Hebert is a 28 y.o. female.  Patient presents with 3-week history of fatigue, wheezing, congestion, cough.  Her symptoms had improved after taking Zithromax  and prednisone  but then returned in the last couple of weeks.  She now has ear pain also.  She has been using her albuterol  inhaler but it may be expired.  No chest pain or shortness of breath.  Patient had a telehealth visit on 05/05/2024; diagnosed with acute bronchitis; treated with Zithromax , prednisone , Tessalon  Perles, Bromfed-DM .  She had another telehealth visit on 05/23/2024 and was instructed to be seen in person.  Her medical history includes reactive airway disease.  The history is provided by the patient and medical records.    Past Medical History:  Diagnosis Date   Anxiety    Depression    GERD (gastroesophageal reflux disease)     Patient Active Problem List   Diagnosis Date Noted   Overweight with body mass index (BMI) of 28 to 28.9 in adult 02/06/2022   GAD (generalized anxiety disorder) 02/06/2022   Gastroesophageal reflux disease without esophagitis 12/22/2020   Rosacea 12/22/2020   Reactive airway disease 12/22/2020    Past Surgical History:  Procedure Laterality Date   TONSILLECTOMY     TONSILLECTOMY      OB History     Gravida  2   Para  1   Term  1   Preterm      AB  1   Living  1      SAB  1   IAB      Ectopic      Multiple      Live Births  1            Home Medications    Prior to Admission medications   Medication Sig Start Date End Date Taking? Authorizing Provider  albuterol  (VENTOLIN  HFA) 108 (90 Base) MCG/ACT inhaler Inhale 1-2 puffs into the lungs every 6 (six) hours as needed. 05/25/24  Yes Corlis Burnard DEL, NP  amoxicillin -clavulanate (AUGMENTIN ) 875-125 MG  tablet Take 1 tablet by mouth every 12 (twelve) hours. 05/25/24  Yes Corlis Burnard DEL, NP  predniSONE  (DELTASONE ) 10 MG tablet Take 4 tablets (40 mg total) by mouth daily for 5 days. 05/25/24 05/30/24 Yes Corlis Burnard DEL, NP  benzonatate  (TESSALON ) 100 MG capsule Take 1 capsule (100 mg total) by mouth 3 (three) times daily as needed for cough. Patient not taking: Reported on 05/25/2024 05/05/24   Gladis Elsie BROCKS, PA-C  brompheniramine-pseudoephedrine-DM 30-2-10 MG/5ML syrup Take 5 mLs by mouth 4 (four) times daily as needed. Patient not taking: Reported on 05/25/2024 05/07/24   Burnette, Jennifer M, PA-C  ipratropium (ATROVENT ) 0.03 % nasal spray Place 2 sprays into both nostrils every 12 (twelve) hours. 09/09/23   Burnette, Jennifer M, PA-C  norethindrone -ethinyl estradiol  (LOESTRIN ) 1-20 MG-MCG tablet Take 1 tablet by mouth daily. 08/01/23   Jayne Harlene CROME, CNM    Family History Family History  Problem Relation Age of Onset   Depression Mother    Hypertension Father    Depression Father     Social History Social History   Tobacco Use   Smoking status: Never   Smokeless tobacco: Never  Vaping Use   Vaping status: Never Used  Substance Use Topics   Alcohol use: Not Currently   Drug use: Never     Allergies   Patient has no known allergies.   Review of Systems Review of Systems  Constitutional:  Positive for fatigue. Negative for chills and fever.  HENT:  Positive for congestion and ear pain. Negative for sore throat.   Respiratory:  Positive for cough and wheezing.      Physical Exam Triage Vital Signs ED Triage Vitals [05/25/24 1745]  Encounter Vitals Group     BP      Girls Systolic BP Percentile      Girls Diastolic BP Percentile      Boys Systolic BP Percentile      Boys Diastolic BP Percentile      Pulse      Resp      Temp      Temp src      SpO2      Weight      Height      Head Circumference      Peak Flow      Pain Score 7     Pain Loc      Pain  Education      Exclude from Growth Chart    No data found.  Updated Vital Signs BP 132/81   Pulse 100   Temp 98 F (36.7 C)   Resp 18   SpO2 98%   Visual Acuity Right Eye Distance:   Left Eye Distance:   Bilateral Distance:    Right Eye Near:   Left Eye Near:    Bilateral Near:     Physical Exam Constitutional:      General: She is not in acute distress. HENT:     Right Ear: Tympanic membrane is erythematous.     Left Ear: Tympanic membrane normal.     Nose: Congestion present.     Mouth/Throat:     Mouth: Mucous membranes are moist.     Pharynx: Oropharynx is clear.  Cardiovascular:     Rate and Rhythm: Normal rate and regular rhythm.     Heart sounds: Normal heart sounds.  Pulmonary:     Effort: Pulmonary effort is normal. No respiratory distress.     Breath sounds: Normal breath sounds. No wheezing.  Neurological:     Mental Status: She is alert.      UC Treatments / Results  Labs (all labs ordered are listed, but only abnormal results are displayed) Labs Reviewed - No data to display  EKG   Radiology No results found.  Procedures Procedures (including critical care time)  Medications Ordered in UC Medications - No data to display  Initial Impression / Assessment and Plan / UC Course  I have reviewed the triage vital signs and the nursing notes.  Pertinent labs & imaging results that were available during my care of the patient were reviewed by me and considered in my medical decision making (see chart for details).    Asthma exacerbation, acute upper respiratory infection, right otitis media.  Afebrile and vital signs are stable.  Lungs are clear at this time and O2 sat is 98% on room air.  Treating today with albuterol  inhaler, prednisone , Augmentin .  Instructed patient to follow-up with her PCP tomorrow.  ED precautions given.  Education provided on asthma and URI.  Patient agrees to plan of care.  Final Clinical Impressions(s) / UC Diagnoses    Final diagnoses:  Mild intermittent asthma with acute exacerbation  Acute upper respiratory infection  Right otitis media, unspecified otitis media type     Discharge Instructions      Use the albuterol  inhaler as directed.  Take the Augmentin  and prednisone  as directed.    Follow up with your primary care provider tomorrow.  Go to the emergency department if you have worsening symptoms.        ED Prescriptions     Medication Sig Dispense Auth. Provider   amoxicillin -clavulanate (AUGMENTIN ) 875-125 MG tablet Take 1 tablet by mouth every 12 (twelve) hours. 14 tablet Corlis Sor H, NP   predniSONE  (DELTASONE ) 10 MG tablet Take 4 tablets (40 mg total) by mouth daily for 5 days. 20 tablet Corlis Sor DEL, NP   albuterol  (VENTOLIN  HFA) 108 (90 Base) MCG/ACT inhaler Inhale 1-2 puffs into the lungs every 6 (six) hours as needed. 6.7 g Corlis Sor DEL, NP      PDMP not reviewed this encounter.   Corlis Sor DEL, NP 05/25/24 [phone]

## 2024-05-25 NOTE — Discharge Instructions (Addendum)
 Use the albuterol inhaler as directed.  Take the Augmentin and prednisone as directed.  Follow up with your primary care provider tomorrow.  Go to the emergency department if you have worsening symptoms.

## 2024-05-25 NOTE — ED Triage Notes (Signed)
 Patient to Urgent Care with complaints of cough/ wheezing/ productive cough/ fatigue.    Reports at the beginning of November she was treated for bronchitis but didn't feel like the symptoms fully resolve.   Meds: cough syrup/ flonase / breathing treatments.

## 2024-05-26 ENCOUNTER — Ambulatory Visit: Payer: Self-pay

## 2024-06-08 ENCOUNTER — Other Ambulatory Visit: Payer: Self-pay

## 2024-06-08 ENCOUNTER — Ambulatory Visit (INDEPENDENT_AMBULATORY_CARE_PROVIDER_SITE_OTHER)

## 2024-06-08 ENCOUNTER — Ambulatory Visit
Admission: RE | Admit: 2024-06-08 | Discharge: 2024-06-08 | Disposition: A | Discharge status: Home / Self Care | Attending: Emergency Medicine

## 2024-06-08 VITALS — BP 121/84 | HR 99 | Temp 98.5°F | Resp 18

## 2024-06-08 DIAGNOSIS — H9201 Otalgia, right ear: Secondary | ICD-10-CM

## 2024-06-08 DIAGNOSIS — R052 Subacute cough: Secondary | ICD-10-CM

## 2024-06-08 DIAGNOSIS — R059 Cough, unspecified: Secondary | ICD-10-CM | POA: Diagnosis not present

## 2024-06-08 MED ORDER — PROMETHAZINE-DM 6.25-15 MG/5ML PO SYRP
5.0000 mL | ORAL_SOLUTION | Freq: Four times a day (QID) | ORAL | 0 refills | Status: AC | PRN
  Filled 2024-06-08: qty 118, 6d supply, fill #0

## 2024-06-08 NOTE — Discharge Instructions (Addendum)
 Follow up with your primary care provider tomorrow.  Go to the emergency department if you have worsening symptoms.    I will call you with the chest x-ray result.    Take the Promethazine  DM as directed.  Do not drive, operate machinery, drink alcohol, or perform dangerous activities while taking this medication as it may cause drowsiness.

## 2024-06-08 NOTE — ED Triage Notes (Addendum)
 Patient to Urgent Care with complaints of cough and right sided ear pain x1 month. Poor appetite. Reports she wakes up in the morning and coughs up a lot of thick, green mucus. Has noticed some blood in sputum.   Increased usage of her albuterol  inhaler. Using delsym.

## 2024-06-08 NOTE — ED Provider Notes (Signed)
 Tracey Hebert    CSN: [id number] Arrival date & time: 06/08/24  1709      History   Chief Complaint Chief Complaint  Patient presents with   Cough    Entered by patient    HPI Tracey Hebert is a 28 y.o. female.  Patient presents with 1 month history of ear pain and cough.  She states her ear pain improved after her most recent antibiotic but her cough has gotten worse.  She states that is productive of thick green sputum and has had some blood-tinged.  No fever, chest pain, shortness of breath.  She has been treating her symptoms with her albuterol  inhaler and Delsym.  Patient was seen here on 05/25/2024; diagnosed with asthma exacerbation, URI, otitis media; treated with Augmentin , albuterol  inhaler, prednisone .  She had a telehealth visit on 05/05/2024 and was diagnosed with acute bronchitis which was treated with Zithromax , prednisone , Tessalon  Perles, Bromfed-DM .  Her medical history includes reactive airway disease.  The history is provided by the patient and medical records.    Past Medical History:  Diagnosis Date   Anxiety    Depression    GERD (gastroesophageal reflux disease)     Patient Active Problem List   Diagnosis Date Noted   Overweight with body mass index (BMI) of 28 to 28.9 in adult 02/06/2022   GAD (generalized anxiety disorder) 02/06/2022   Gastroesophageal reflux disease without esophagitis 12/22/2020   Rosacea 12/22/2020   Reactive airway disease 12/22/2020    Past Surgical History:  Procedure Laterality Date   TONSILLECTOMY     TONSILLECTOMY      OB History     Gravida  2   Para  1   Term  1   Preterm      AB  1   Living  1      SAB  1   IAB      Ectopic      Multiple      Live Births  1            Home Medications    Prior to Admission medications   Medication Sig Start Date End Date Taking? Authorizing Provider  promethazine -dextromethorphan (PROMETHAZINE -DM) 6.25-15 MG/5ML syrup Take 5 mLs  by mouth 4 (four) times daily as needed. 06/08/24  Yes Corlis Burnard DEL, NP  albuterol  (VENTOLIN  HFA) 108 (90 Base) MCG/ACT inhaler Inhale 1-2 puffs into the lungs every 6 (six) hours as needed. 05/25/24   Corlis Burnard DEL, NP  ipratropium (ATROVENT ) 0.03 % nasal spray Place 2 sprays into both nostrils every 12 (twelve) hours. Patient not taking: Reported on 06/08/2024 09/09/23   Vivienne Delon HERO, PA-C  norethindrone -ethinyl estradiol  (LOESTRIN ) 1-20 MG-MCG tablet Take 1 tablet by mouth daily. 08/01/23   Jayne Harlene CROME, CNM    Family History Family History  Problem Relation Age of Onset   Depression Mother    Hypertension Father    Depression Father     Social History Social History   Tobacco Use   Smoking status: Never   Smokeless tobacco: Never  Vaping Use   Vaping status: Never Used  Substance Use Topics   Alcohol use: Not Currently   Drug use: Never     Allergies   Patient has no known allergies.   Review of Systems Review of Systems  Constitutional:  Negative for chills and fever.  HENT:  Positive for ear pain. Negative for sore throat.   Respiratory:  Positive for cough. Negative  for shortness of breath and wheezing.   Cardiovascular:  Negative for chest pain and palpitations.     Physical Exam Triage Vital Signs ED Triage Vitals  Encounter Vitals Group     BP 06/08/24 1747 121/84     Girls Systolic BP Percentile --      Girls Diastolic BP Percentile --      Boys Systolic BP Percentile --      Boys Diastolic BP Percentile --      Pulse Rate 06/08/24 1747 99     Resp 06/08/24 1747 18     Temp 06/08/24 1747 98.5 F (36.9 C)     Temp src --      SpO2 06/08/24 1747 97 %     Weight --      Height --      Head Circumference --      Peak Flow --      Pain Score 06/08/24 1758 5     Pain Loc --      Pain Education --      Exclude from Growth Chart --    No data found.  Updated Vital Signs BP 121/84   Pulse 99   Temp 98.5 F (36.9 C)   Resp 18    LMP 06/08/2024   SpO2 97%   Visual Acuity Right Eye Distance:   Left Eye Distance:   Bilateral Distance:    Right Eye Near:   Left Eye Near:    Bilateral Near:     Physical Exam Constitutional:      General: She is not in acute distress. HENT:     Right Ear: Tympanic membrane normal.     Left Ear: Tympanic membrane normal.     Nose: Nose normal.     Mouth/Throat:     Mouth: Mucous membranes are moist.     Pharynx: Oropharynx is clear.     Comments: PND Cardiovascular:     Rate and Rhythm: Normal rate and regular rhythm.     Heart sounds: Normal heart sounds.  Pulmonary:     Effort: Pulmonary effort is normal. No respiratory distress.     Breath sounds: Normal breath sounds. No wheezing.  Neurological:     Mental Status: She is alert.      UC Treatments / Results  Labs (all labs ordered are listed, but only abnormal results are displayed) Labs Reviewed - No data to display  EKG   Radiology DG Chest 2 View Result Date: 06/08/2024 EXAM: 2 VIEW(S) XRAY OF THE CHEST 06/08/2024 06:18:16 PM COMPARISON: None available. CLINICAL HISTORY: cough x 4 weeks cough x 4 weeks FINDINGS: LUNGS AND PLEURA: No focal pulmonary opacity. No pleural effusion. No pneumothorax. HEART AND MEDIASTINUM: No acute abnormality of the cardiac and mediastinal silhouettes. BONES AND SOFT TISSUES: No acute osseous abnormality. IMPRESSION: 1. No acute process. Electronically signed by: Franky Crease MD 06/08/2024 06:32 PM EST RP Workstation: [id number]    Procedures Procedures (including critical care time)  Medications Ordered in UC Medications - No data to display  Initial Impression / Assessment and Plan / UC Course  I have reviewed the triage vital signs and the nursing notes.  Pertinent labs & imaging results that were available during my care of the patient were reviewed by me and considered in my medical decision making (see chart for details).    Subacute cough, right otalgia.  Afebrile  and vital signs are stable.  Lungs are clear and O2 sat is 97% on  room air.  CXR negative.  Patient has been symptomatic for a month.  She has been treated with 2 rounds of prednisone  and 2 antibiotics.  She has been using her albuterol  inhaler and taking Delsym.  She declines prescription for Tessalon  Perles as she states these do not work well for her.  Treating cough today with Promethazine  DM; precautions for drowsiness with this medication discussed.  Instructed her to follow-up with her PCP tomorrow.  ED precautions given.  Education provided on cough and earache.  She agrees to plan of care.  Final Clinical Impressions(s) / UC Diagnoses   Final diagnoses:  Subacute cough  Otalgia of right ear     Discharge Instructions      Follow up with your primary care provider tomorrow.  Go to the emergency department if you have worsening symptoms.    I will call you with the chest x-ray result.    Take the Promethazine  DM as directed.  Do not drive, operate machinery, drink alcohol, or perform dangerous activities while taking this medication as it may cause drowsiness.       ED Prescriptions     Medication Sig Dispense Auth. Provider   promethazine -dextromethorphan (PROMETHAZINE -DM) 6.25-15 MG/5ML syrup Take 5 mLs by mouth 4 (four) times daily as needed. 118 mL Corlis Burnard DEL, NP      PDMP not reviewed this encounter.   Corlis Burnard DEL, NP 06/08/24 [phone]

## 2024-06-18 ENCOUNTER — Other Ambulatory Visit: Payer: Self-pay

## 2024-06-19 ENCOUNTER — Ambulatory Visit: Admitting: Internal Medicine
